# Patient Record
Sex: Female | Born: 1970 | Race: White | Hispanic: No | Smoking: Former smoker
Health system: Southern US, Community
[De-identification: ages and names within clinical notes are randomized; demographics above are authoritative.]

## PROBLEM LIST (undated history)

## (undated) DIAGNOSIS — Z8489 Family history of other specified conditions: Secondary | ICD-10-CM

## (undated) DIAGNOSIS — M940 Chondrocostal junction syndrome [Tietze]: Secondary | ICD-10-CM

## (undated) DIAGNOSIS — F419 Anxiety disorder, unspecified: Secondary | ICD-10-CM

## (undated) HISTORY — DX: Chondrocostal junction syndrome (tietze): M94.0

## (undated) HISTORY — PX: NO PAST SURGERIES: SHX2092

## (undated) HISTORY — DX: Anxiety disorder, unspecified: F41.9

---

## 2008-05-05 DIAGNOSIS — R519 Headache, unspecified: Secondary | ICD-10-CM | POA: Insufficient documentation

## 2008-05-05 DIAGNOSIS — K279 Peptic ulcer, site unspecified, unspecified as acute or chronic, without hemorrhage or perforation: Secondary | ICD-10-CM | POA: Insufficient documentation

## 2008-05-05 HISTORY — DX: Headache, unspecified: R51.9

## 2008-05-05 HISTORY — DX: Peptic ulcer, site unspecified, unspecified as acute or chronic, without hemorrhage or perforation: K27.9

## 2012-11-10 DIAGNOSIS — Q676 Pectus excavatum: Secondary | ICD-10-CM | POA: Insufficient documentation

## 2012-11-10 DIAGNOSIS — G8929 Other chronic pain: Secondary | ICD-10-CM

## 2012-11-10 HISTORY — DX: Other chronic pain: G89.29

## 2012-11-10 HISTORY — DX: Pectus excavatum: Q67.6

## 2012-11-25 DIAGNOSIS — R002 Palpitations: Secondary | ICD-10-CM | POA: Insufficient documentation

## 2012-11-25 HISTORY — DX: Palpitations: R00.2

## 2013-03-21 ENCOUNTER — Emergency Department (HOSPITAL_COMMUNITY): Payer: BC Managed Care – PPO

## 2013-03-21 ENCOUNTER — Encounter (HOSPITAL_COMMUNITY): Payer: Self-pay | Admitting: Emergency Medicine

## 2013-03-21 ENCOUNTER — Emergency Department (HOSPITAL_COMMUNITY)
Admission: EM | Admit: 2013-03-21 | Discharge: 2013-03-21 | Disposition: A | Discharge status: Home / Self Care | Payer: BC Managed Care – PPO | Attending: Emergency Medicine | Admitting: Emergency Medicine

## 2013-03-21 DIAGNOSIS — Y9239 Other specified sports and athletic area as the place of occurrence of the external cause: Secondary | ICD-10-CM | POA: Insufficient documentation

## 2013-03-21 DIAGNOSIS — Z91040 Latex allergy status: Secondary | ICD-10-CM | POA: Insufficient documentation

## 2013-03-21 DIAGNOSIS — Y9364 Activity, baseball: Secondary | ICD-10-CM | POA: Insufficient documentation

## 2013-03-21 DIAGNOSIS — Z791 Long term (current) use of non-steroidal anti-inflammatories (NSAID): Secondary | ICD-10-CM | POA: Insufficient documentation

## 2013-03-21 DIAGNOSIS — S0511XA Contusion of eyeball and orbital tissues, right eye, initial encounter: Secondary | ICD-10-CM

## 2013-03-21 DIAGNOSIS — S0010XA Contusion of unspecified eyelid and periocular area, initial encounter: Secondary | ICD-10-CM | POA: Insufficient documentation

## 2013-03-21 DIAGNOSIS — R110 Nausea: Secondary | ICD-10-CM | POA: Insufficient documentation

## 2013-03-21 DIAGNOSIS — W219XXA Striking against or struck by unspecified sports equipment, initial encounter: Secondary | ICD-10-CM | POA: Insufficient documentation

## 2013-03-21 DIAGNOSIS — Y92838 Other recreation area as the place of occurrence of the external cause: Secondary | ICD-10-CM

## 2013-03-21 MED ORDER — TETRACAINE HCL 0.5 % OP SOLN
1.0000 [drp] | Freq: Once | OPHTHALMIC | Status: AC
  Administered 2013-03-21: 2 [drp] via OPHTHALMIC
  Filled 2013-03-21: qty 2

## 2013-03-21 MED ORDER — FLUORESCEIN SODIUM 1 MG OP STRP
1.0000 | ORAL_STRIP | Freq: Once | OPHTHALMIC | Status: AC
  Administered 2013-03-21: 1 via OPHTHALMIC
  Filled 2013-03-21: qty 1

## 2013-03-21 MED ORDER — IBUPROFEN 800 MG PO TABS: 800.0000 mg | ORAL_TABLET | Freq: Three times a day (TID) | ORAL | Status: DC

## 2013-03-21 MED ORDER — HYDROCODONE-ACETAMINOPHEN 5-325 MG PO TABS: 1.0000 | ORAL_TABLET | Freq: Four times a day (QID) | ORAL | Status: DC | PRN

## 2013-03-21 NOTE — Discharge Instructions (Signed)
Please follow up with your primary care physician in 1-2 days. If you do not have one please call the [facility] number listed above. Please follow up with the Facial Surgeon to schedule a follow up appointment. Please take pain medication as prescribed and as needed for pain. Please do not drive on narcotic pain medication. Please read all discharge instructions and return precautions.   Eye Contusion An eye contusion is a deep bruise of the eye. This is often called a "black eye." Contusions are the result of an injury that caused bleeding under the skin. The contusion may turn blue, purple, or yellow. Minor injuries will give you a painless contusion, but more severe contusions may stay painful and swollen for a few weeks. If the eye contusion only involves the eyelids and tissues around the eye, the injured area will get better within a few days to weeks. However, eye contusions can be serious and affect the eyeball and sight. CAUSES   Blunt injury or trauma to the face or eye area.  A forehead injury that causes the blood under the skin to work its way down to the eyelids.  Rubbing the eyes due to irritation. SYMPTOMS   Swelling and redness around the eye.  Bruising around the eye.  Tenderness, soreness, or pain around the eye.  Blurry vision.  Tearing.  Eyeball redness. DIAGNOSIS  A diagnosis is usually based on a thorough exam of the eye and surrounding area. The eye must be looked at carefully to make sure it is not injured and to make sure nothing else will threaten your vision. A vision test may be done. An X-ray or computed tomography (CT) scan may be needed to determine if there are any associated injuries, such as broken bones (fractures). TREATMENT  If there is an injury to the eye, treatment will be determined by the nature of the injury. HOME CARE INSTRUCTIONS   Put ice on the injured area.  Put ice in a plastic bag.  Place a towel between your  skin and the bag.  Leave the ice on for 15-20 minutes, 03-04 times a day.  If it is determined that there is no injury to the eye, you may continue normal activities.  Sunglasses may be worn to protect your eyes from bright light if light is uncomfortable.  Sleep with your head elevated. You can put an extra pillow under your head. This may help with discomfort.  Only take over-the-counter or prescription medicines for pain, discomfort, or fever as directed by your caregiver. Do not take aspirin for the first few days. This may increase bruising. SEEK IMMEDIATE MEDICAL CARE IF:   You have any form of vision loss.  You have double vision.  You feel nauseous.  You feel dizzy, sleepy, or like you will faint.  You have any fluid discharge from the eye or your nose.  You have swelling and discoloration that does not fade. MAKE SURE YOU:   Understand these instructions.  Will watch your condition.  Will get help right away if you are not doing well or get worse. Document Released: 01/26/2000 Document Revised: 04/22/2011 Document Reviewed: 12/14/2010 [facility] Patient Information 2014 [place].  Establish relationship with primary care doctor as discussed. A resource guide and information on the Affordable Care Act has been provided for your information.    RESOURCE GUIDE  If you do not have a primary care doctor to follow up with regarding today's visit, please call the  [facility] at [phone] to make an appointment. Hours of operation are 10am - 7pm, Monday through Friday, and they have a sliding scale fee.   Insufficient Money for Medicine: Contact [employer]:  call "211" or [facility] [phone].  No Primary Care Doctor: - Call [facility]  [phone] - can help you locate a primary care doctor that  accepts your insurance, provides certain services, etc. - Physician Referral Service- [phone]  Agencies that provide inexpensive  medical care: - [facility]  [phone] - [facility]  [phone] - [facility]  [phone] - [facility]  [phone] - [facility]  [phone] [facility]  [phone]  Medicaid-accepting [facility] Providers: - [facility]- [address]  [phone], Mon-Fri 9am-7pm, Sat 9am-1pm - [facility]- [address] [place], [address] [place]  [phone] - [facility]- [address], [address] [place]  [phone] [facility]- [address]  [phone] - Renaye Rakers- [address] [place], [address], [phone]  Only accepts [place] patients after they have their name  applied to their card  Self Pay (no insurance) in [place]: - Sickle Cell Patients: Dr Willey Blade, [facility]  [address] [place], [phone] - [facility]- [address] [place]  [phone]       [facility]- [address]       -     [facility]- see information above (Speak to [employer] if you do not have insurance)       -  [facility]- [address] [place], [phone]       -  [facility]- [address],  [phone]       -  [facility]- [address], [phone]       -  Dr Julio Sicks-  [address], [place], [phone]       -  [facility]- [address] [phone]       -  [facility]- [address] [phone], also [address] [phone]       -    [facility]- [address] [place], [phone], 1st & 3rd Saturday   every month, 10am-1pm  1) Find a Doctor and Pay Out of Pocket Although you won't have to find out who is covered by your insurance plan, it is a good idea to ask around and get recommendations. You will then need to call the office and see if the doctor you have chosen will accept you  as a new patient and what types of options they offer for patients who are self-pay. Some doctors offer discounts or will set up payment plans for their patients who do not have insurance, but you will need to ask so you aren't surprised when you get to your appointment.  2) Contact Your Local Health Department Not all health departments have doctors that can see patients for sick visits, but many do, so it is worth a call to see if yours does. If you don't know where your local health department is, you can check in your phone book. The CDC also has a tool to help you locate your state's health department,  and many state websites also have listings of all of their local health departments.  3) Find a Walk-in Clinic If your illness is not likely to be very severe or complicated, you may want to try a walk in clinic. These are popping up all over the country in pharmacies, drugstores, and shopping centers. They're usually staffed by nurse practitioners or physician assistants that have been trained to treat common illnesses and complaints. They're usually fairly quick and inexpensive. However, if you have serious medical issues or chronic medical problems, these are probably not your best option

## 2013-03-21 NOTE — ED Notes (Signed)
PA at bedside.

## 2013-03-21 NOTE — ED Notes (Signed)
New ice pack give.

## 2013-03-21 NOTE — ED Notes (Signed)
Spoke with CT regarding delay.  They are aware of pt.

## 2013-03-21 NOTE — ED Notes (Signed)
Patient reports that she was playing baseball and was hit in the right eye by a baseball.  Denies loc or changes in level of consciousness, denies falling after being struck, denies neck pain. Remains full mobility of her right eye but reports pain when she looks to the left.  Patient also states that since her injury she has felt nauseated and dizzy intermittently, but has not vomited.

## 2013-03-21 NOTE — ED Notes (Signed)
Pt presents to department for evaluation of facial injury. Pt states she was hit in face with baseball. Swelling noted to forehead and R eye. Denies LOC. No nausea/vomiting. Pt is alert and oriented x4. 8/10 pain at the time.

## 2013-03-21 NOTE — ED Provider Notes (Signed)
CSN: [id number]     Arrival date & time 03/21/13  1632 History   First MD Initiated Contact with Patient 03/21/13 1831     Chief Complaint  Patient presents with  . Facial Pain   (Consider location/radiation/quality/duration/timing/severity/associated sxs/prior Treatment) HPI Comments: Patient is an otherwise healthy 43 year old female presenting to the emergency department after she was hit in the face with a straight baseball. Patient notes she had immediate pain to the right orbital region and right forehead with swelling. She states she did have some visual disturbance immediately after the injury and felt nauseous but denies any loss of consciousness. She states she has been icing her injury that has alleviated her pain. She denies any aggravating factors. She denies wanting anything for pain at this time. Patient denies any visual disturbance currently, contact lens use, emesis, confusion.   History reviewed. No pertinent past medical history. History reviewed. No pertinent past surgical history. No family history on file. History  Substance Use Topics  . Smoking status: Never Smoker   . Smokeless tobacco: Not on file  . Alcohol Use: Yes     Comment: social   OB History   Grav Para Term Preterm Abortions TAB SAB Ect Mult Living                 Review of Systems  Constitutional: Negative for fever and chills.  HENT: Positive for facial swelling.   Eyes:       Orbital swelling and bruising  Gastrointestinal: Positive for nausea. Negative for vomiting.  Neurological: Negative for syncope.  All other systems reviewed and are negative.    Allergies  Other and Latex  Home Medications   Current Outpatient Rx  Name  Route  Sig  Dispense  Refill  . acetaminophen (TYLENOL) 500 MG tablet   Oral   Take 500 mg by mouth every 6 (six) hours as needed for moderate pain.         Marland Kitchen. ibuprofen (ADVIL,MOTRIN) 200 MG tablet   Oral   Take 400 mg by mouth every 6 (six) hours as  needed for headache.         Marland Kitchen. HYDROcodone-acetaminophen (NORCO/VICODIN) 5-325 MG per tablet   Oral   Take 1 tablet by mouth every 6 (six) hours as needed for severe pain.   12 tablet   0   . ibuprofen (ADVIL,MOTRIN) 800 MG tablet   Oral   Take 1 tablet (800 mg total) by mouth 3 (three) times daily.   21 tablet   0    BP 118/80  Pulse 65  Temp(Src) 97.4 F (36.3 C) (Oral)  Resp 18  Ht 5\' 6"  (1.676 m)  Wt 140 lb (63.504 kg)  BMI 22.61 kg/m2  SpO2 98%  LMP 03/14/2013 Physical Exam  Constitutional: She is oriented to person, place, and time. She appears well-developed and well-nourished. No distress.  HENT:  Head: Normocephalic and atraumatic.  Right Ear: External ear normal.  Left Ear: External ear normal.  Nose: Nose normal.  Mouth/Throat: Oropharynx is clear and moist. No oropharyngeal exudate.  Eyes: Conjunctivae and EOM are normal. Pupils are equal, round, and reactive to light. Right eye exhibits no discharge and no exudate. No foreign body present in the right eye. Left eye exhibits no discharge and no exudate. No foreign body present in the left eye.  Fundoscopic exam:      The right eye shows no hemorrhage and no papilledema.       The left eye  shows no hemorrhage and no papilledema.  Slit lamp exam:      The right eye shows no corneal abrasion, no corneal flare, no corneal ulcer, no foreign body and no fluorescein uptake.    Visual Acuity:Bilateral Distance: 20/25 ; R Distance: 20/70 ; L Distance: 20/50  Neck: Normal range of motion. Neck supple.  Cardiovascular: Normal rate, regular rhythm and normal heart sounds.   Pulmonary/Chest: Effort normal and breath sounds normal. No respiratory distress.  Abdominal: Soft. There is no tenderness.  Musculoskeletal: Normal range of motion.  Neurological: She is alert and oriented to person, place, and time. She has normal strength. No cranial nerve deficit or sensory deficit. Gait normal. GCS eye subscore is 4. GCS  verbal subscore is 5. GCS motor subscore is 6.  Bilateral heel-knee-shin intact. No pronator drift.   Skin: Skin is warm and dry. She is not diaphoretic.  Psychiatric: She has a normal mood and affect.    ED Course  Procedures (including critical care time) Medications  tetracaine (PONTOCAINE) 0.5 % ophthalmic solution 1-2 drop (2 drops Right Eye Given 03/21/13 2120)  fluorescein ophthalmic strip 1 strip (1 strip Right Eye Given 03/21/13 2119)    Labs Review Labs Reviewed - No data to display Imaging Review Ct Maxillofacial Wo Cm  03/21/2013   CLINICAL DATA:  Struck in face with a baseball. Right periorbital swelling.  EXAM: CT MAXILLOFACIAL WITHOUT CONTRAST  TECHNIQUE: Multidetector CT imaging of the maxillofacial structures was performed. Multiplanar CT image reconstructions were also generated. A small metallic BB was placed on the right temple in order to reliably differentiate right from left.  COMPARISON:  None.  FINDINGS: There is moderate periorbital and preseptal soft tissue swelling on the right. No postseptal hematoma is demonstrated. There is no evidence of globe injury or lens displacement. The extra-ocular muscles and optic nerves appear normal.  There is no evidence of orbital or other facial fracture. The paranasal sinuses are clear without air-fluid levels. The mastoids and middle ears are clear.  IMPRESSION: 1. Preseptal and periorbital soft tissue injury on the right. No evidence of postseptal hematoma or globe injury. 2. No evidence of orbital or facial fracture.   Electronically Signed   By: Roxy Horseman M.D.   On: 03/21/2013 20:16    EKG Interpretation   None       MDM   1. Periorbital contusion of right eye    Filed Vitals:   03/21/13 2144  BP: 118/80  Pulse: 65  Temp:   Resp: 18    Afebrile, NAD, non-toxic appearing, AAOx4. No neurofocal deficits. No evidence of entrapment on EOM. R periorbital contusion. No corneal abrasion or ulcer on fluorescein stain. CT  scan negative for periorbital or other facial fracture. Maxillofacial referral given for f/u. Symptoms managed in ED. Return precautions discussed. Patient is agreeable to plan. Patient is stable at time of discharge. Patient d/w with Dr. Redgie Grayer, agrees with plan.         Lise Auer Doralee Kocak, PA-C 03/22/13 0025

## 2013-03-21 NOTE — ED Notes (Signed)
Patient transported to CT 

## 2013-03-22 NOTE — ED Provider Notes (Signed)
Medical screening examination/treatment/procedure(s) were performed by non-physician practitioner and as supervising physician I was immediately available for consultation/collaboration.  EKG Interpretation   None          Leibish Mcgregor, MD 03/22/13 0027 

## 2017-02-19 DIAGNOSIS — N898 Other specified noninflammatory disorders of vagina: Secondary | ICD-10-CM | POA: Insufficient documentation

## 2017-02-19 HISTORY — DX: Other specified noninflammatory disorders of vagina: N89.8

## 2018-11-11 DIAGNOSIS — E782 Mixed hyperlipidemia: Secondary | ICD-10-CM

## 2018-11-11 HISTORY — DX: Mixed hyperlipidemia: E78.2

## 2019-01-15 LAB — HM MAMMOGRAPHY

## 2019-03-30 LAB — HM PAP SMEAR: HM Pap smear: NORMAL

## 2019-09-17 ENCOUNTER — Other Ambulatory Visit: Payer: Self-pay

## 2019-09-17 ENCOUNTER — Emergency Department (INDEPENDENT_AMBULATORY_CARE_PROVIDER_SITE_OTHER)
Admission: EM | Admit: 2019-09-17 | Discharge: 2019-09-17 | Disposition: A | Discharge status: Home / Self Care | Payer: BC Managed Care – PPO | Source: Home / Self Care | Attending: Family Medicine | Admitting: Family Medicine

## 2019-09-17 DIAGNOSIS — L03316 Cellulitis of umbilicus: Secondary | ICD-10-CM | POA: Diagnosis not present

## 2019-09-17 MED ORDER — KETOCONAZOLE 2 % EX CREA: 1.0000 "application " | TOPICAL_CREAM | Freq: Every day | CUTANEOUS | 0 refills | Status: DC

## 2019-09-17 MED ORDER — MUPIROCIN CALCIUM 2 % EX CREA: TOPICAL_CREAM | CUTANEOUS | 1 refills | Status: DC

## 2019-09-17 NOTE — ED Triage Notes (Signed)
Patient presents to Urgent Care with complaints of itching and discharge from her navel since two days ago. Patient reports it itched at first and then started to have some yellow drainage, has been putting lotion on the itchy area.

## 2019-09-17 NOTE — Discharge Instructions (Addendum)
Keep area clean and dry. °

## 2019-09-17 NOTE — ED Provider Notes (Signed)
[facility]    CSN: [id number] Arrival date & time: 09/17/19  0824      History   Chief Complaint Chief Complaint  Patient presents with   Rash    HPI Veronica Pennington is a 49 y.o. female.   Patient complains of itching and slight drainage from her umbilical area that started two days ago.  The area is not swollen or painful.  The history is provided by the patient.  Rash Location: umbilicus. Quality: itchiness, redness and weeping   Quality: not blistering, not bruising, not burning, not draining, not painful, not peeling, not scaling and not swelling   Severity:  Mild Onset quality:  Gradual Duration:  2 days Timing:  Constant Progression:  Worsening Chronicity:  New Context: not animal contact, not chemical exposure, not hot tub use, not insect bite/sting, not medications, not new detergent/soap and not plant contact   Relieved by:  Nothing Worsened by:  Nothing Ineffective treatments: moisturizing lotion. Associated symptoms: no abdominal pain, no diarrhea, no fatigue, no fever and no induration     History reviewed. No pertinent past medical history.  There are no problems to display for this patient.   History reviewed. No pertinent surgical history.  OB History   No obstetric history on file.      Home Medications    Prior to Admission medications   Medication Sig Start Date End Date Taking? Authorizing Provider  acetaminophen (TYLENOL) 500 MG tablet Take 500 mg by mouth every 6 (six) hours as needed for moderate pain.    [provider]  HYDROcodone-acetaminophen (NORCO/VICODIN) 5-325 MG per tablet Take 1 tablet by mouth every 6 (six) hours as needed for severe pain. 03/21/13   Piepenbrink, Victorino Dike, PA-C  ibuprofen (ADVIL,MOTRIN) 200 MG tablet Take 400 mg by mouth every 6 (six) hours as needed for headache.    [provider]  ibuprofen (ADVIL,MOTRIN) 800 MG tablet Take 1 tablet (800 mg total) by mouth 3 (three)  times daily. 03/21/13   Piepenbrink, Victorino Dike, PA-C  ketoconazole (NIZORAL) 2 % cream Apply 1 application topically daily. Use for 2 weeks 09/17/19   Lattie Haw, MD  mupirocin cream (BACTROBAN) 2 % Apply to affected area 3 times daily. Use for 10 days 09/17/19 09/16/20  Lattie Haw, MD    Family History Family History  Problem Relation Age of Onset   Hypertension Mother    Rheum arthritis Mother     Social History Social History   Tobacco Use   Smoking status: Never Smoker   Smokeless tobacco: Never Used  Substance Use Topics   Alcohol use: Yes    Comment: social   Drug use: No     Allergies   Other and Latex   Review of Systems Review of Systems  Constitutional: Negative for fatigue and fever.  Gastrointestinal: Negative for abdominal pain and diarrhea.  Skin: Positive for rash.  All other systems reviewed and are negative.    Physical Exam Triage Vital Signs ED Triage Vitals  Enc Vitals Group     BP 09/17/19 0840 125/81     Pulse Rate 09/17/19 0840 62     Resp 09/17/19 0840 16     Temp 09/17/19 0840 98.2 F (36.8 C)     Temp Source 09/17/19 0840 Oral     SpO2 09/17/19 0840 99 %     Weight --      Height --      Head Circumference --  Peak Flow --      Pain Score 09/17/19 0839 0     Pain Loc --      Pain Edu? --      Excl. in GC? --    No data found.  Updated Vital Signs BP 125/81 (BP Location: Right Arm)    Pulse 62    Temp 98.2 F (36.8 C) (Oral)    Resp 16    LMP 02/16/2018 (Approximate)    SpO2 99%   Visual Acuity Right Eye Distance:   Left Eye Distance:   Bilateral Distance:    Right Eye Near:   Left Eye Near:    Bilateral Near:     Physical Exam Vitals and nursing note reviewed.  Constitutional:      General: She is not in acute distress. HENT:     Head: Normocephalic.     Mouth/Throat:     Pharynx: Oropharynx is clear.  Eyes:     Pupils: Pupils are equal, round, and reactive to light.  Cardiovascular:     Rate  and Rhythm: Normal rate.  Pulmonary:     Effort: Pulmonary effort is normal.  Abdominal:     General: Abdomen is flat.     Palpations: Abdomen is soft.     Tenderness: There is no abdominal tenderness.       Comments: Umbilicus has mild erythema with minimal discharge present.  No tenderness.  There is faint erythema spreading above the umbilicus.  Skin:    General: Skin is warm and dry.  Neurological:     Mental Status: She is alert.      UC Treatments / Results  Labs (all labs ordered are listed, but only abnormal results are displayed) Labs Reviewed  WOUND CULTURE    EKG   Radiology No results found.  Procedures Procedures (including critical care time)  Medications Ordered in UC Medications - No data to display  Initial Impression / Assessment and Plan / UC Course  I have reviewed the triage vital signs and the nursing notes.  Pertinent labs & imaging results that were available during my care of the patient were reviewed by me and considered in my medical decision making (see chart for details).    Culture pending.  Suspect candida intertrigo with secondary bacterial infection. Patient prefers topical treatment; begin mupirocin cream.  Begin empiric Nizoral cream also. If not improving about 5 to 7 days, switch to doxycycline.   Final Clinical Impressions(s) / UC Diagnoses   Final diagnoses:  Cellulitis of umbilicus     Discharge Instructions     Keep area clean and dry.    ED Prescriptions    Medication Sig Dispense Auth. Provider   ketoconazole (NIZORAL) 2 % cream Apply 1 application topically daily. Use for 2 weeks 30 g Lattie Haw, MD   mupirocin cream (BACTROBAN) 2 % Apply to affected area 3 times daily. Use for 10 days 15 g Lattie Haw, MD        Lattie Haw, MD 09/20/19 1213

## 2019-09-20 LAB — WOUND CULTURE
MICRO NUMBER:: 10796739
SPECIMEN QUALITY:: ADEQUATE

## 2019-09-23 ENCOUNTER — Telehealth: Payer: Self-pay | Admitting: Emergency Medicine

## 2019-09-23 NOTE — Telephone Encounter (Signed)
Normal skin flora, continue using cream until healed, it is getting better.

## 2019-10-25 ENCOUNTER — Ambulatory Visit (INDEPENDENT_AMBULATORY_CARE_PROVIDER_SITE_OTHER): Payer: BC Managed Care – PPO | Admitting: Medical-Surgical

## 2019-10-25 ENCOUNTER — Other Ambulatory Visit: Payer: Self-pay | Admitting: Medical-Surgical

## 2019-10-25 ENCOUNTER — Encounter: Payer: Self-pay | Admitting: Medical-Surgical

## 2019-10-25 VITALS — BP 100/62 | HR 54 | Temp 97.6°F | Ht 67.5 in | Wt 168.7 lb

## 2019-10-25 DIAGNOSIS — L0292 Furuncle, unspecified: Secondary | ICD-10-CM

## 2019-10-25 DIAGNOSIS — F419 Anxiety disorder, unspecified: Secondary | ICD-10-CM | POA: Diagnosis not present

## 2019-10-25 DIAGNOSIS — Z7689 Persons encountering health services in other specified circumstances: Secondary | ICD-10-CM

## 2019-10-25 DIAGNOSIS — M940 Chondrocostal junction syndrome [Tietze]: Secondary | ICD-10-CM | POA: Diagnosis not present

## 2019-10-25 DIAGNOSIS — L0293 Carbuncle, unspecified: Secondary | ICD-10-CM

## 2019-10-25 DIAGNOSIS — Z0000 Encounter for general adult medical examination without abnormal findings: Secondary | ICD-10-CM

## 2019-10-25 HISTORY — DX: Furuncle, unspecified: L02.92

## 2019-10-25 HISTORY — DX: Carbuncle, unspecified: L02.93

## 2019-10-25 NOTE — Progress Notes (Signed)
New Patient Office Visit  Subjective:  Patient ID: Veronica Pennington, female    DOB: 08-Sep-1970  Age: 49 y.o. MRN: [id number]  CC:  Chief Complaint  Patient presents with  . Establish Care    HPI [name] presents to establish care.  Costochondritis-recurrent, treats with heat, stretches, cold which are moderately helpful; some flares for several day then resolves.  Anxiety- worse since husband passed away. Seeing a counselor every 2-3 weeks. Tried a medication in April but did not tolerate.   Boils- recurring sores in different locations (buttocs, neck, forehead, and umbilicus). Usually treats successfully with Mupirocin ointment.   Past Medical History:  Diagnosis Date  . Anxiety   . Costochondritis     Past Surgical History:  Procedure Laterality Date  . NO PAST SURGERIES      Family History  Problem Relation Age of Onset  . Rheum arthritis Mother   . Hypertension Mother   . Ankylosing spondylitis Mother   . Heart attack Father   . Hypertension Father   . Stroke Maternal Grandfather     Social History   Socioeconomic History  . Marital status: Widowed    Spouse name: Not on file  . Number of children: Not on file  . Years of education: Not on file  . Highest education level: Not on file  Occupational History  . Not on file  Tobacco Use  . Smoking status: Former Smoker    Packs/day: 1.00    Years: 10.00    Pack years: 10.00    Quit date: 1998    Years since quitting: 23.7  . Smokeless tobacco: Never Used  Substance and Sexual Activity  . Alcohol use: Yes    Comment: Occasionally  . Drug use: No  . Sexual activity: Not Currently  Other Topics Concern  . Not on file  Social History Narrative  . Not on file   Social Determinants of Health   Financial Resource Strain:   . Difficulty of Paying Living Expenses: Not on file  Food Insecurity:   . Worried About Programme researcher, broadcasting/film/video in the Last Year: Not on file  . Ran Out of Food  in the Last Year: Not on file  Transportation Needs:   . Lack of Transportation (Medical): Not on file  . Lack of Transportation (Non-Medical): Not on file  Physical Activity:   . Days of Exercise per Week: Not on file  . Minutes of Exercise per Session: Not on file  Stress:   . Feeling of Stress : Not on file  Social Connections:   . Frequency of Communication with Friends and Family: Not on file  . Frequency of Social Gatherings with Friends and Family: Not on file  . Attends Religious Services: Not on file  . Active Member of Clubs or Organizations: Not on file  . Attends Banker Meetings: Not on file  . Marital Status: Not on file  Intimate Partner Violence:   . Fear of Current or Ex-Partner: Not on file  . Emotionally Abused: Not on file  . Physically Abused: Not on file  . Sexually Abused: Not on file    ROS Review of Systems  Objective:   Today's Vitals: BP 100/62   Pulse (!) 54   Temp 97.6 F (36.4 C) (Oral)   Ht 5' 7.5" (1.715 m)   Wt 168 lb 11.2 oz (76.5 kg)   LMP 02/16/2018 (Approximate)   SpO2 98%   BMI 26.03 kg/m  Physical Exam  Assessment & Plan:   1. Encounter to establish care Reviewed available information and discussed health care concerns with patient.  She will be due for annual physical exam with lab work soon.  2. Anxiety Continue therapy every 2 to 3 weeks.  Encouraged to the me know if she should decide she would like to retry medication.  3. Costochondritis Continue conservative treatment at home.  4. Recurrent boils None currently.  Outpatient Encounter Medications as of 10/25/2019  Medication Sig  . acetaminophen (TYLENOL) 500 MG tablet Take 500 mg by mouth every 6 (six) hours as needed for moderate pain.  Marland Kitchen ascorbic acid (VITAMIN C) 500 MG tablet Take 1 tablet by mouth daily as needed.  Marland Kitchen b complex vitamins capsule Take 1 capsule by mouth daily as needed.  Marland Kitchen ibuprofen (ADVIL,MOTRIN) 200 MG tablet Take 400 mg by mouth  every 6 (six) hours as needed for headache.  Marland Kitchen MAGNESIUM PO Take 1 tablet by mouth daily as needed.  . Potassium Gluconate 2.5 MEQ TABS Take 1 tablet by mouth daily as needed.  Marland Kitchen VITAMIN D PO Take 1 tablet by mouth daily as needed.  . [DISCONTINUED] HYDROcodone-acetaminophen (NORCO/VICODIN) 5-325 MG per tablet Take 1 tablet by mouth every 6 (six) hours as needed for severe pain. (Patient not taking: Reported on 10/25/2019)  . [DISCONTINUED] ibuprofen (ADVIL,MOTRIN) 800 MG tablet Take 1 tablet (800 mg total) by mouth 3 (three) times daily. (Patient not taking: Reported on 10/25/2019)  . [DISCONTINUED] ketoconazole (NIZORAL) 2 % cream Apply 1 application topically daily. Use for 2 weeks (Patient not taking: Reported on 10/25/2019)  . [DISCONTINUED] mupirocin cream (BACTROBAN) 2 % Apply to affected area 3 times daily. Use for 10 days (Patient not taking: Reported on 10/25/2019)   No facility-administered encounter medications on file as of 10/25/2019.    Follow-up: Return for annual physical exam at your convenience.   Thayer Ohm, DNP, APRN, FNP-BC [facility]

## 2020-04-10 LAB — COMPLETE METABOLIC PANEL WITH GFR
AG Ratio: 2.2 (calc) (ref 1.0–2.5)
ALT: 21 U/L (ref 6–29)
AST: 18 U/L (ref 10–35)
Albumin: 4.4 g/dL (ref 3.6–5.1)
Alkaline phosphatase (APISO): 56 U/L (ref 37–153)
BUN: 12 mg/dL (ref 7–25)
CO2: 24 mmol/L (ref 20–32)
Calcium: 9 mg/dL (ref 8.6–10.4)
Chloride: 107 mmol/L (ref 98–110)
Creat: 0.76 mg/dL (ref 0.50–1.05)
GFR, Est African American: 106 mL/min/{1.73_m2} (ref >=60)
GFR, Est Non African American: 91 mL/min/{1.73_m2} (ref >=60)
Globulin: 2 g/dL (calc) (ref 1.9–3.7)
Glucose, Bld: 98 mg/dL (ref 65–99)
Potassium: 4.5 mmol/L (ref 3.5–5.3)
Sodium: 139 mmol/L (ref 135–146)
Total Bilirubin: 0.5 mg/dL (ref 0.2–1.2)
Total Protein: 6.4 g/dL (ref 6.1–8.1)

## 2020-04-10 LAB — CBC
HCT: 40.1 % (ref 35.0–45.0)
Hemoglobin: 13.6 g/dL (ref 11.7–15.5)
MCH: 31.1 pg (ref 27.0–33.0)
MCHC: 33.9 g/dL (ref 32.0–36.0)
MCV: 91.8 fL (ref 80.0–100.0)
MPV: 11.4 fL (ref 7.5–12.5)
Platelets: 194 10*3/uL (ref 140–400)
RBC: 4.37 10*6/uL (ref 3.80–5.10)
RDW: 12 % (ref 11.0–15.0)
WBC: 4.5 10*3/uL (ref 3.8–10.8)

## 2020-04-10 LAB — LIPID PANEL
Cholesterol: 220 mg/dL — ABNORMAL HIGH (ref <200)
HDL: 58 mg/dL (ref >=50)
LDL Cholesterol (Calc): 140 mg/dL (calc) — ABNORMAL HIGH
Non-HDL Cholesterol (Calc): 162 mg/dL (calc) — ABNORMAL HIGH (ref <130)
Total CHOL/HDL Ratio: 3.8 (calc) (ref <5.0)
Triglycerides: 102 mg/dL (ref <150)

## 2020-04-11 NOTE — Progress Notes (Signed)
HPI: Veronica Pennington is a 50 y.o. female who  has a past medical history of Anxiety and Costochondritis.  she presents to [facility] today, 04/12/20,  for chief complaint of: Annual physical exam  Dentist: Every 6 months, no current concerns Eye exam: Up-to-date, wears glasses, no concerns Exercise: None intentional Diet: Avoids milk or ice cream but otherwise eats all food groups Pap smear: Has this scheduled but thinks her last 1 was in 2020 Mammogram: Completed last month Colon cancer screening: None yet, reviewed options and she will let me know which 1 she would like to do COVID vaccine: Done, no booster yet  Concerns:  Past medical, surgical, social and family history reviewed:  Patient Active Problem List   Diagnosis Date Noted  . Anxiety 10/25/2019  . Costochondritis 10/25/2019  . Recurrent boils 10/25/2019    Past Surgical History:  Procedure Laterality Date  . NO PAST SURGERIES      Social History   Tobacco Use  . Smoking status: Former Smoker    Packs/day: 1.00    Years: 10.00    Pack years: 10.00    Quit date: 1998    Years since quitting: 24.1  . Smokeless tobacco: Never Used  Substance Use Topics  . Alcohol use: Not Currently    Family History  Problem Relation Age of Onset  . Rheum arthritis Mother   . Hypertension Mother   . Ankylosing spondylitis Mother   . Heart attack Father   . Hypertension Father   . Stroke Maternal Grandfather      Current medication list and allergy/intolerance information reviewed:    Current Outpatient Medications  Medication Sig Dispense Refill  . acetaminophen (TYLENOL) 500 MG tablet Take 500 mg by mouth every 6 (six) hours as needed for moderate pain.    Marland Kitchen ascorbic acid (VITAMIN C) 500 MG tablet Take 1 tablet by mouth daily as needed.    Marland Kitchen b complex vitamins capsule Take 1 capsule by mouth daily as needed.    Marland Kitchen ibuprofen (ADVIL,MOTRIN) 200 MG tablet Take 400 mg by  mouth every 6 (six) hours as needed for headache.    Marland Kitchen MAGNESIUM PO Take 1 tablet by mouth daily as needed.    Marland Kitchen omeprazole (PRILOSEC) 20 MG capsule Take 1 capsule by mouth daily.    . Potassium Gluconate 2.5 MEQ TABS Take 1 tablet by mouth daily as needed.    Marland Kitchen VITAMIN D PO Take 1 tablet by mouth daily as needed.     No current facility-administered medications for this visit.    Allergies  Allergen Reactions  . Other Hives    "Emergen-C"  . Latex Itching and Rash      Review of Systems:  Constitutional:  No  fever, no chills, No recent illness, No unintentional weight changes. No significant fatigue.   HEENT: No  headache, no vision change, no hearing change, No sore throat, No  sinus pressure  Cardiac: + Intermittent chest pain, No  pressure, + intermittent palpitations, No  Orthopnea  Respiratory:  No  shortness of breath. No  Cough  Gastrointestinal: No  abdominal pain, No  nausea, No  vomiting,  No  blood in stool, No  diarrhea, + intermittent constipation   Musculoskeletal: No new myalgia/arthralgia  Skin: No  Rash, No other wounds/concerning lesions  Genitourinary: + Stress incontinence, No  abnormal genital bleeding, No abnormal genital discharge  Hem/Onc: No  easy bruising/bleeding, No  abnormal lymph node  Endocrine:  No cold intolerance,  No heat intolerance. No polyuria/polydipsia/polyphagia,+ hot flashes  Neurologic: No  weakness, No  dizziness, No  slurred speech/focal weakness/facial droop, + lightheadedness  Psychiatric: No  concerns with depression, + concerns with intermittent anxiety related to work stress, No sleep problems, No mood problems  Exam:  BP 101/67   Pulse (!) 56   Temp 97.7 F (36.5 C)   Ht 5' 7.5" (1.715 m)   Wt 179 lb 6.4 oz (81.4 kg)   LMP 02/16/2018 (Approximate)   SpO2 97%   BMI 27.68 kg/m   Constitutional: VS see above. General Appearance: alert, well-developed, well-nourished, NAD  Eyes: Normal lids and conjunctive,  non-icteric sclera  Ears, Nose, Mouth, Throat: MMM, Normal external inspection ears/nares/mouth/lips/gums. TM normal bilaterally.   Neck: No masses, trachea midline. No thyroid enlargement. No tenderness/mass appreciated. No lymphadenopathy  Respiratory: Normal respiratory effort. no wheeze, no rhonchi, no rales  Cardiovascular: S1/S2 normal, no murmur, no rub/gallop auscultated. RRR. No lower extremity edema.  No carotid bruit or JVD. No abdominal aortic bruit.  Gastrointestinal: Nontender, no masses. No hepatomegaly, no splenomegaly. No hernia appreciated. Bowel sounds normal. Rectal exam deferred.   Musculoskeletal: Gait normal. No clubbing/cyanosis of digits.   Neurological: Normal balance/coordination. No tremor. No cranial nerve deficit on limited exam. Motor and sensation intact and symmetric. Cerebellar reflexes intact.   Skin: warm, dry, intact. No rash/ulcer. No concerning nevi or subq nodules on limited exam.    Psychiatric: Normal judgment/insight. Normal mood and affect. Oriented x3.    Results for orders placed or performed in visit on 10/25/19 (from the past 72 hour(s))  CBC     Status: None   Collection Time: 04/10/20  8:20 AM  Result Value Ref Range   WBC 4.5 3.8 - 10.8 Thousand/uL   RBC 4.37 3.80 - 5.10 Million/uL   Hemoglobin 13.6 11.7 - 15.5 g/dL   HCT 40.1 35.0 - 45.0 %   MCV 91.8 80.0 - 100.0 fL   MCH 31.1 27.0 - 33.0 pg   MCHC 33.9 32.0 - 36.0 g/dL   RDW 12.0 11.0 - 15.0 %   Platelets 194 140 - 400 Thousand/uL   MPV 11.4 7.5 - 12.5 fL  COMPLETE METABOLIC PANEL WITH GFR     Status: None   Collection Time: 04/10/20  8:20 AM  Result Value Ref Range   Glucose, Bld 98 65 - 99 mg/dL    Comment: .            Fasting reference interval .    BUN 12 7 - 25 mg/dL   Creat 0.76 0.50 - 1.05 mg/dL    Comment: For patients >17 years of age, the reference limit for Creatinine is approximately 13% higher for people identified as African-American. .    GFR, Est  Non African American 91 > OR = 60 mL/min/1.95m   GFR, Est African American 106 > OR = 60 mL/min/1.776m  BUN/Creatinine Ratio NOT APPLICABLE 6 - 22 (calc)   Sodium 139 135 - 146 mmol/L   Potassium 4.5 3.5 - 5.3 mmol/L   Chloride 107 98 - 110 mmol/L   CO2 24 20 - 32 mmol/L   Calcium 9.0 8.6 - 10.4 mg/dL   Total Protein 6.4 6.1 - 8.1 g/dL   Albumin 4.4 3.6 - 5.1 g/dL   Globulin 2.0 1.9 - 3.7 g/dL (calc)   AG Ratio 2.2 1.0 - 2.5 (calc)   Total Bilirubin 0.5 0.2 - 1.2 mg/dL   Alkaline phosphatase (APISO)  56 37 - 153 U/L   AST 18 10 - 35 U/L   ALT 21 6 - 29 U/L  Lipid panel     Status: Abnormal   Collection Time: 04/10/20  8:20 AM  Result Value Ref Range   Cholesterol 220 (H) <200 mg/dL   HDL 58 > OR = 50 mg/dL   Triglycerides 102 <150 mg/dL   LDL Cholesterol (Calc) 140 (H) mg/dL (calc)    Comment: Reference range: <100 . Desirable range <100 mg/dL for primary prevention;   <70 mg/dL for patients with CHD or diabetic patients  with > or = 2 CHD risk factors. Marland Kitchen LDL-C is now calculated using the Martin-Hopkins  calculation, which is a validated novel method providing  better accuracy than the Friedewald equation in the  estimation of LDL-C.  Cresenciano Genre et al. Annamaria Helling. 0350;093(81): 2061-2068  (http://education.[employer].com/faq/FAQ164)    Total CHOL/HDL Ratio 3.8 <5.0 (calc)   Non-HDL Cholesterol (Calc) 162 (H) <130 mg/dL (calc)    Comment: For patients with diabetes plus 1 major ASCVD risk  factor, treating to a non-HDL-C goal of <100 mg/dL  (LDL-C of <70 mg/dL) is considered a therapeutic  option.     No results found.   ASSESSMENT/PLAN:   1. Annual physical exam Labs completed prior to her physical appointment.  Reviewed results and discussed recommendations for management of elevated cholesterol.  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  Up-to-date on preventative care aside from colon  cancer screening.  Advised to notify me of her preference for colon cancer screening and we will get that taken care of.   No orders of the defined types were placed in this encounter.   No orders of the defined types were placed in this encounter.   Patient Instructions   Preventive Care 90-68 Years Old, Female Preventive care refers to lifestyle choices and visits with your health care provider that can promote health and wellness. This includes:  A yearly physical exam. This is also called an annual wellness visit.  Regular dental and eye exams.  Immunizations.  Screening for certain conditions.  Healthy lifestyle choices, such as: ? Eating a healthy diet. ? Getting regular exercise. ? Not using drugs or products that contain nicotine and tobacco. ? Limiting alcohol use. What can I expect for my preventive care visit? Physical exam Your health care provider will check your:  Height and weight. These may be used to calculate your BMI (body mass index). BMI is a measurement that tells if you are at a healthy weight.  Heart rate and blood pressure.  Body temperature.  Skin for abnormal spots. Counseling Your health care provider may ask you questions about your:  Past medical problems.  Family's medical history.  Alcohol, tobacco, and drug use.  Emotional well-being.  Home life and relationship well-being.  Sexual activity.  Diet, exercise, and sleep habits.  Work and work Statistician.  Access to firearms.  Method of birth control.  Menstrual cycle.  Pregnancy history. What immunizations do I need? Vaccines are usually given at various ages, according to a schedule. Your health care provider will recommend vaccines for you based on your age, medical history, and lifestyle or other factors, such as travel or where you work.   What tests do I need? Blood tests  Lipid and cholesterol levels. These may be checked every 5 years, or more often if you are  over 14 years old.  Hepatitis C test.  Hepatitis B test. Screening  Lung cancer screening. You may have this screening every year starting at age 23 if you have a 30-pack-year history of smoking and currently smoke or have quit within the past 15 years.  Colorectal cancer screening. ? All adults should have this screening starting at age 11 and continuing until age 73. ? Your health care provider may recommend screening at age 48 if you are at increased risk. ? You will have tests every 1-10 years, depending on your results and the type of screening test.  Diabetes screening. ? This is done by checking your blood sugar (glucose) after you have not eaten for a while (fasting). ? You may have this done every 1-3 years.  Mammogram. ? This may be done every 1-2 years. ? Talk with your health care provider about when you should start having regular mammograms. This may depend on whether you have a family history of breast cancer.  BRCA-related cancer screening. This may be done if you have a family history of breast, ovarian, tubal, or peritoneal cancers.  Pelvic exam and Pap test. ? This may be done every 3 years starting at age 67. ? Starting at age 76, this may be done every 5 years if you have a Pap test in combination with an HPV test. Other tests  STD (sexually transmitted disease) testing, if you are at risk.  Bone density scan. This is done to screen for osteoporosis. You may have this scan if you are at high risk for osteoporosis. Talk with your health care provider about your test results, treatment options, and if necessary, the need for more tests. Follow these instructions at home: Eating and drinking  Eat a diet that includes fresh fruits and vegetables, whole grains, lean protein, and low-fat dairy products.  Take vitamin and mineral supplements as recommended by your health care provider.  Do not drink alcohol if: ? Your health care provider tells you not to  drink. ? You are pregnant, may be pregnant, or are planning to become pregnant.  If you drink alcohol: ? Limit how much you have to 0-1 drink a day. ? Be aware of how much alcohol is in your drink. In the [place] one drink equals one 12 oz bottle of beer (355 mL), one 5 oz glass of wine (148 mL), or one 1 oz glass of hard liquor (44 mL).   Lifestyle  Take daily care of your teeth and gums. Brush your teeth every morning and night with fluoride toothpaste. Floss one time each day.  Stay active. Exercise for at least 30 minutes 5 or more days each week.  Do not use any products that contain nicotine or tobacco, such as cigarettes, e-cigarettes, and chewing tobacco. If you need help quitting, ask your health care provider.  Do not use drugs.  If you are sexually active, practice safe sex. Use a condom or other form of protection to prevent STIs (sexually transmitted infections).  If you do not wish to become pregnant, use a form of birth control. If you plan to become pregnant, see your health care provider for a prepregnancy visit.  If told by your health care provider, take low-dose aspirin daily starting at age 31.  Find healthy ways to cope with stress, such as: ? Meditation, yoga, or listening to music. ? Journaling. ? Talking to a trusted person. ? Spending time with friends and family. Safety  Always wear your seat belt while driving or  riding in a vehicle.  Do not drive: ? If you have been drinking alcohol. Do not ride with someone who has been drinking. ? When you are tired or distracted. ? While texting.  Wear a helmet and other protective equipment during sports activities.  If you have firearms in your house, make sure you follow all gun safety procedures. What's next?  Visit your health care provider once a year for an annual wellness visit.  Ask your health care provider how often you should have your eyes and teeth checked.  Stay up to date on all  vaccines. This information is not intended to replace advice given to you by your health care provider. Make sure you discuss any questions you have with your health care provider. Document Revised: 11/02/2019 Document Reviewed: 10/09/2017 Elsevier Patient Education  2021 [employer].  Follow-up plan: Return in about 1 year (around 04/12/2021) for annual physical exam or sooner if needed.  Clearnce Sorrel, DNP, APRN, FNP-BC [facility]

## 2020-04-12 ENCOUNTER — Ambulatory Visit (INDEPENDENT_AMBULATORY_CARE_PROVIDER_SITE_OTHER): Payer: BC Managed Care – PPO | Admitting: Medical-Surgical

## 2020-04-12 ENCOUNTER — Other Ambulatory Visit: Payer: Self-pay

## 2020-04-12 ENCOUNTER — Encounter: Payer: Self-pay | Admitting: Medical-Surgical

## 2020-04-12 VITALS — BP 101/67 | HR 56 | Temp 97.7°F | Ht 67.5 in | Wt 179.4 lb

## 2020-04-12 DIAGNOSIS — Z0000 Encounter for general adult medical examination without abnormal findings: Secondary | ICD-10-CM | POA: Diagnosis not present

## 2020-04-12 NOTE — Patient Instructions (Signed)
Preventive Care 84-51 Years Old, Female Preventive care refers to lifestyle choices and visits with your health care provider that can promote health and wellness. This includes:  A yearly physical exam. This is also called an annual wellness visit.  Regular dental and eye exams.  Immunizations.  Screening for certain conditions.  Healthy lifestyle choices, such as: ? Eating a healthy diet. ? Getting regular exercise. ? Not using drugs or products that contain nicotine and tobacco. ? Limiting alcohol use. What can I expect for my preventive care visit? Physical exam Your health care provider will check your:  Height and weight. These may be used to calculate your BMI (body mass index). BMI is a measurement that tells if you are at a healthy weight.  Heart rate and blood pressure.  Body temperature.  Skin for abnormal spots. Counseling Your health care provider may ask you questions about your:  Past medical problems.  Family's medical history.  Alcohol, tobacco, and drug use.  Emotional well-being.  Home life and relationship well-being.  Sexual activity.  Diet, exercise, and sleep habits.  Work and work Statistician.  Access to firearms.  Method of birth control.  Menstrual cycle.  Pregnancy history. What immunizations do I need? Vaccines are usually given at various ages, according to a schedule. Your health care provider will recommend vaccines for you based on your age, medical history, and lifestyle or other factors, such as travel or where you work.   What tests do I need? Blood tests  Lipid and cholesterol levels. These may be checked every 5 years, or more often if you are over 3 years old.  Hepatitis C test.  Hepatitis B test. Screening  Lung cancer screening. You may have this screening every year starting at age 73 if you have a 30-pack-year history of smoking and currently smoke or have quit within the past 15 years.  Colorectal cancer  screening. ? All adults should have this screening starting at age 52 and continuing until age 17. ? Your health care provider may recommend screening at age 49 if you are at increased risk. ? You will have tests every 1-10 years, depending on your results and the type of screening test.  Diabetes screening. ? This is done by checking your blood sugar (glucose) after you have not eaten for a while (fasting). ? You may have this done every 1-3 years.  Mammogram. ? This may be done every 1-2 years. ? Talk with your health care provider about when you should start having regular mammograms. This may depend on whether you have a family history of breast cancer.  BRCA-related cancer screening. This may be done if you have a family history of breast, ovarian, tubal, or peritoneal cancers.  Pelvic exam and Pap test. ? This may be done every 3 years starting at age 10. ? Starting at age 11, this may be done every 5 years if you have a Pap test in combination with an HPV test. Other tests  STD (sexually transmitted disease) testing, if you are at risk.  Bone density scan. This is done to screen for osteoporosis. You may have this scan if you are at high risk for osteoporosis. Talk with your health care provider about your test results, treatment options, and if necessary, the need for more tests. Follow these instructions at home: Eating and drinking  Eat a diet that includes fresh fruits and vegetables, whole grains, lean protein, and low-fat dairy products.  Take vitamin and mineral supplements  as recommended by your health care provider.  Do not drink alcohol if: ? Your health care provider tells you not to drink. ? You are pregnant, may be pregnant, or are planning to become pregnant.  If you drink alcohol: ? Limit how much you have to 0-1 drink a day. ? Be aware of how much alcohol is in your drink. In the [place] one drink equals one 12 oz bottle of beer (355 mL), one 5 oz glass of  wine (148 mL), or one 1 oz glass of hard liquor (44 mL).   Lifestyle  Take daily care of your teeth and gums. Brush your teeth every morning and night with fluoride toothpaste. Floss one time each day.  Stay active. Exercise for at least 30 minutes 5 or more days each week.  Do not use any products that contain nicotine or tobacco, such as cigarettes, e-cigarettes, and chewing tobacco. If you need help quitting, ask your health care provider.  Do not use drugs.  If you are sexually active, practice safe sex. Use a condom or other form of protection to prevent STIs (sexually transmitted infections).  If you do not wish to become pregnant, use a form of birth control. If you plan to become pregnant, see your health care provider for a prepregnancy visit.  If told by your health care provider, take low-dose aspirin daily starting at age 50.  Find healthy ways to cope with stress, such as: ? Meditation, yoga, or listening to music. ? Journaling. ? Talking to a trusted person. ? Spending time with friends and family. Safety  Always wear your seat belt while driving or riding in a vehicle.  Do not drive: ? If you have been drinking alcohol. Do not ride with someone who has been drinking. ? When you are tired or distracted. ? While texting.  Wear a helmet and other protective equipment during sports activities.  If you have firearms in your house, make sure you follow all gun safety procedures. What's next?  Visit your health care provider once a year for an annual wellness visit.  Ask your health care provider how often you should have your eyes and teeth checked.  Stay up to date on all vaccines. This information is not intended to replace advice given to you by your health care provider. Make sure you discuss any questions you have with your health care provider. Document Revised: 11/02/2019 Document Reviewed: 10/09/2017 Elsevier Patient Education  2021 Elsevier Inc.  

## 2020-05-08 ENCOUNTER — Ambulatory Visit (INDEPENDENT_AMBULATORY_CARE_PROVIDER_SITE_OTHER): Payer: BC Managed Care – PPO

## 2020-05-08 ENCOUNTER — Ambulatory Visit (INDEPENDENT_AMBULATORY_CARE_PROVIDER_SITE_OTHER): Payer: BC Managed Care – PPO | Admitting: Medical-Surgical

## 2020-05-08 ENCOUNTER — Encounter: Payer: Self-pay | Admitting: Medical-Surgical

## 2020-05-08 ENCOUNTER — Other Ambulatory Visit: Payer: Self-pay

## 2020-05-08 VITALS — BP 118/76 | HR 60 | Temp 98.1°F | Ht 67.5 in | Wt 175.0 lb

## 2020-05-08 DIAGNOSIS — R319 Hematuria, unspecified: Secondary | ICD-10-CM | POA: Diagnosis not present

## 2020-05-08 DIAGNOSIS — R109 Unspecified abdominal pain: Secondary | ICD-10-CM | POA: Diagnosis not present

## 2020-05-08 DIAGNOSIS — R1032 Left lower quadrant pain: Secondary | ICD-10-CM

## 2020-05-08 DIAGNOSIS — N200 Calculus of kidney: Secondary | ICD-10-CM | POA: Diagnosis not present

## 2020-05-08 LAB — POCT URINALYSIS DIPSTICK
Bilirubin, UA: NEGATIVE
Glucose, UA: NEGATIVE
Ketones, UA: NEGATIVE
Nitrite, UA: NEGATIVE
Odor: NEGATIVE
Protein, UA: NEGATIVE
Spec Grav, UA: 1.025 (ref 1.010–1.025)
Urobilinogen, UA: 0.2 E.U./dL
pH, UA: 5.5 (ref 5.0–8.0)

## 2020-05-08 MED ORDER — TAMSULOSIN HCL 0.4 MG PO CAPS: 0.4000 mg | ORAL_CAPSULE | Freq: Every day | ORAL | 0 refills | Status: DC

## 2020-05-08 MED ORDER — TRAMADOL HCL 50 MG PO TABS: 50.0000 mg | ORAL_TABLET | Freq: Three times a day (TID) | ORAL | 0 refills | Status: AC | PRN

## 2020-05-08 NOTE — Progress Notes (Signed)
  Subjective:    CC: possible kidney stone  HPI: Pleasant 50 year old female presenting for evaluation of left flank pain that started 2 days ago, rated 2/10, constant.  Notes that her urine was brown over the weekend and she became concerned due to her mother having a history of kidney stones.  Denies dysuria, urgency, frequency, hesitancy, incomplete emptying, nausea, vomiting, fever, and chills.  No change in volume of urine.  Has not tried any medications for discomfort.  I reviewed the past medical history, family history, social history, surgical history, and allergies today and no changes were needed.  Please see the problem list section below in epic for further details.  Past Medical History: Past Medical History:  Diagnosis Date  . Anxiety   . Costochondritis    Past Surgical History: Past Surgical History:  Procedure Laterality Date  . NO PAST SURGERIES     Social History: Social History   Socioeconomic History  . Marital status: Widowed    Spouse name: Not on file  . Number of children: Not on file  . Years of education: Not on file  . Highest education level: Not on file  Occupational History  . Not on file  Tobacco Use  . Smoking status: Former Smoker    Packs/day: 1.00    Years: 10.00    Pack years: 10.00    Quit date: 1998    Years since quitting: 24.2  . Smokeless tobacco: Never Used  Substance and Sexual Activity  . Alcohol use: Not Currently  . Drug use: No  . Sexual activity: Not Currently  Other Topics Concern  . Not on file  Social History Narrative  . Not on file   Social Determinants of Health   Financial Resource Strain: Not on file  Food Insecurity: Not on file  Transportation Needs: Not on file  Physical Activity: Not on file  Stress: Not on file  Social Connections: Not on file   Family History: Family History  Problem Relation Age of Onset  . Rheum arthritis Mother   . Hypertension Mother   . Ankylosing spondylitis Mother   .  Heart attack Father   . Hypertension Father   . Stroke Maternal Grandfather    Allergies: Allergies  Allergen Reactions  . Other Hives    "Emergen-C"  . Latex Itching and Rash   Medications: See med rec.  Review of Systems: See HPI for pertinent positives and negatives.   Objective:    General: Well Developed, well nourished, and in no acute distress.  Neuro: Alert and oriented x3.  HEENT: Normocephalic, atraumatic.  Skin: Warm and dry. Cardiac: Regular rate and rhythm, no murmurs rubs or gallops, no lower extremity edema.  Respiratory: Clear to auscultation bilaterally. Not using accessory muscles, speaking in full sentences. Abdomen: Soft, nontender, nondistended. Bowel sounds + x 4 quadrants. No HSM appreciated. No CVA tenderness.  Impression and Recommendations:    1. Flank pain 2. Left lower quadrant abdominal pain POCT urinalysis positive for small leukocytes and large blood, otherwise normal.  Sending urine for culture.  Getting stat CT renal stone study for further evaluation.  CT resulted with a 10 mm stone in the left renal pelvis.  Referring to urology for further evaluation. - POCT Urinalysis Dipstick - Urine Culture - CT RENAL STONE STUDY; Future  Return if symptoms worsen or fail to improve. ___________________________________________ Thayer Ohm, DNP, APRN, FNP-BC Primary Care and Sports Medicine [facility] [place]

## 2020-05-10 ENCOUNTER — Other Ambulatory Visit: Payer: Self-pay | Admitting: Urology

## 2020-05-10 DIAGNOSIS — N200 Calculus of kidney: Secondary | ICD-10-CM

## 2020-05-10 LAB — URINE CULTURE
MICRO NUMBER:: 11704011
SPECIMEN QUALITY:: ADEQUATE

## 2020-05-11 ENCOUNTER — Other Ambulatory Visit (HOSPITAL_COMMUNITY)
Admission: RE | Admit: 2020-05-11 | Discharge: 2020-05-11 | Disposition: A | Discharge status: Home / Self Care | Payer: BC Managed Care – PPO | Source: Ambulatory Visit | Attending: Urology | Admitting: Urology

## 2020-05-11 DIAGNOSIS — Z20822 Contact with and (suspected) exposure to covid-19: Secondary | ICD-10-CM | POA: Insufficient documentation

## 2020-05-11 DIAGNOSIS — Z01812 Encounter for preprocedural laboratory examination: Secondary | ICD-10-CM | POA: Insufficient documentation

## 2020-05-11 LAB — SARS CORONAVIRUS 2 (TAT 6-24 HRS): SARS Coronavirus 2: NEGATIVE

## 2020-05-11 NOTE — Progress Notes (Signed)
Patient to arrive at 0800 on 05/15/20. History and medications reviewed. Pre-procedure instructions given. NPO after MN on Sunday except for clear liquids until 0600. Driver secured.

## 2020-05-15 ENCOUNTER — Ambulatory Visit (HOSPITAL_BASED_OUTPATIENT_CLINIC_OR_DEPARTMENT_OTHER)
Admission: RE | Admit: 2020-05-15 | Discharge: 2020-05-15 | Disposition: A | Discharge status: Home / Self Care | Payer: BC Managed Care – PPO | Attending: Urology | Admitting: Urology

## 2020-05-15 ENCOUNTER — Ambulatory Visit (HOSPITAL_COMMUNITY): Payer: BC Managed Care – PPO

## 2020-05-15 ENCOUNTER — Encounter (HOSPITAL_BASED_OUTPATIENT_CLINIC_OR_DEPARTMENT_OTHER)
Admission: RE | Disposition: A | Discharge status: Home / Self Care | Payer: Self-pay | Source: Home / Self Care | Attending: Urology

## 2020-05-15 ENCOUNTER — Encounter (HOSPITAL_BASED_OUTPATIENT_CLINIC_OR_DEPARTMENT_OTHER): Payer: Self-pay | Admitting: Urology

## 2020-05-15 ENCOUNTER — Other Ambulatory Visit: Payer: Self-pay

## 2020-05-15 DIAGNOSIS — Z87891 Personal history of nicotine dependence: Secondary | ICD-10-CM | POA: Insufficient documentation

## 2020-05-15 DIAGNOSIS — N200 Calculus of kidney: Secondary | ICD-10-CM | POA: Insufficient documentation

## 2020-05-15 DIAGNOSIS — Z91040 Latex allergy status: Secondary | ICD-10-CM | POA: Diagnosis not present

## 2020-05-15 HISTORY — PX: EXTRACORPOREAL SHOCK WAVE LITHOTRIPSY: SHX1557

## 2020-05-15 HISTORY — DX: Family history of other specified conditions: Z84.89

## 2020-05-15 SURGERY — LITHOTRIPSY, ESWL
Anesthesia: LOCAL | Laterality: Left

## 2020-05-15 MED ORDER — DIPHENHYDRAMINE HCL 25 MG PO CAPS
ORAL_CAPSULE | ORAL | Status: AC
  Filled 2020-05-15: qty 1

## 2020-05-15 MED ORDER — DIPHENHYDRAMINE HCL 25 MG PO CAPS
25.0000 mg | ORAL_CAPSULE | ORAL | Status: AC
  Administered 2020-05-15: 25 mg via ORAL

## 2020-05-15 MED ORDER — SODIUM CHLORIDE 0.9 % IV SOLN: INTRAVENOUS | Status: DC

## 2020-05-15 MED ORDER — DIAZEPAM 5 MG PO TABS
10.0000 mg | ORAL_TABLET | ORAL | Status: AC
  Administered 2020-05-15: 10 mg via ORAL

## 2020-05-15 MED ORDER — DOCUSATE SODIUM 100 MG PO CAPS: 100.0000 mg | ORAL_CAPSULE | Freq: Every day | ORAL | 0 refills | Status: DC | PRN

## 2020-05-15 MED ORDER — OXYCODONE-ACETAMINOPHEN 5-325 MG PO TABS: 1.0000 | ORAL_TABLET | ORAL | 0 refills | Status: DC | PRN

## 2020-05-15 MED ORDER — CEPHALEXIN 500 MG PO CAPS: 500.0000 mg | ORAL_CAPSULE | Freq: Two times a day (BID) | ORAL | 0 refills | Status: AC

## 2020-05-15 MED ORDER — TAMSULOSIN HCL 0.4 MG PO CAPS: 0.4000 mg | ORAL_CAPSULE | Freq: Every day | ORAL | 1 refills | Status: DC

## 2020-05-15 MED ORDER — CIPROFLOXACIN HCL 500 MG PO TABS
ORAL_TABLET | ORAL | Status: AC
  Filled 2020-05-15: qty 1

## 2020-05-15 MED ORDER — DIAZEPAM 5 MG PO TABS
ORAL_TABLET | ORAL | Status: AC
  Filled 2020-05-15: qty 2

## 2020-05-15 MED ORDER — CIPROFLOXACIN HCL 500 MG PO TABS
500.0000 mg | ORAL_TABLET | ORAL | Status: AC
  Administered 2020-05-15: 500 mg via ORAL

## 2020-05-15 NOTE — H&P (Signed)
Urology Preoperative H&P   Chief Complaint: Left renal stone  History of Present Illness: Veronica Pennington is a 50 y.o. female with left renal stone here for L ESWL. Denies fevers, chills, dysuria.   Past Medical History:  Diagnosis Date  . Anxiety   . Costochondritis   . Family history of adverse reaction to anesthesia    mother has nausea    Past Surgical History:  Procedure Laterality Date  . NO PAST SURGERIES      Allergies:  Allergies  Allergen Reactions  . Other Hives    "Emergen-C"  . Latex Itching and Rash    Family History  Problem Relation Age of Onset  . Rheum arthritis Mother   . Hypertension Mother   . Ankylosing spondylitis Mother   . Heart attack Father   . Hypertension Father   . Stroke Maternal Grandfather     Social History:  reports that she quit smoking about 24 years ago. She has a 10.00 pack-year smoking history. She has never used smokeless tobacco. She reports previous alcohol use. She reports that she does not use drugs.  ROS: A complete review of systems was performed.  All systems are negative except for pertinent findings as noted.  Physical Exam:  Vital signs in last 24 hours: Temp:  [97.4 F (36.3 C)] 97.4 F (36.3 C) (04/04 0839) Pulse Rate:  [52] 52 (04/04 0839) Resp:  [16] 16 (04/04 0839) BP: (116)/(81) 116/81 (04/04 0839) SpO2:  [97 %] 97 % (04/04 0839) Weight:  [77.6 kg] 77.6 kg (04/04 0839) Constitutional:  Alert and oriented, No acute distress Cardiovascular: Regular rate and rhythm Respiratory: Normal respiratory effort, Lungs clear bilaterally GI: Abdomen is soft, nontender, nondistended, no abdominal masses GU: No CVA tenderness Lymphatic: No lymphadenopathy Neurologic: Grossly intact, no focal deficits Psychiatric: Normal mood and affect  Laboratory Data:  No results for input(s): WBC, HGB, HCT, PLT in the last 72 hours.  No results for input(s): NA, K, CL, GLUCOSE, BUN, CALCIUM, CREATININE in the last 72  hours.  Invalid input(s): CO3   No results found for this or any previous visit (from the past 24 hour(s)). Recent Results (from the past 240 hour(s))  Urine Culture     Status: None   Collection Time: 05/08/20  1:24 PM   Specimen: Urine  Result Value Ref Range Status   MICRO NUMBER: [id number]  Final   SPECIMEN QUALITY: Adequate  Final   Sample Source URINE, CLEAN CATCH  Final   STATUS: FINAL  Final   Result:   Final    Mixed genital flora isolated. These superficial bacteria are not indicative of a urinary tract infection. No further organism identification is warranted on this specimen. If clinically indicated, recollect clean-catch, mid-stream urine and transfer  immediately to Urine Culture Transport Tube.   SARS CORONAVIRUS 2 (TAT 6-24 HRS) Nasopharyngeal Nasopharyngeal Swab     Status: None   Collection Time: 05/11/20  2:42 PM   Specimen: Nasopharyngeal Swab  Result Value Ref Range Status   SARS Coronavirus 2 NEGATIVE NEGATIVE Final    Comment: (NOTE) SARS-CoV-2 target nucleic acids are NOT DETECTED.  The SARS-CoV-2 RNA is generally detectable in upper and lower respiratory specimens during the acute phase of infection. Negative results do not preclude SARS-CoV-2 infection, do not rule out co-infections with other pathogens, and should not be used as the sole basis for treatment or other patient management decisions. Negative results must be combined with clinical observations, patient history, and epidemiological  information. The expected result is Negative.  Fact Sheet for Patients: HairSlick.no  Fact Sheet for Healthcare Providers: quierodirigir.com  This test is not yet approved or cleared by the [place] FDA and  has been authorized for detection and/or diagnosis of SARS-CoV-2 by FDA under an Emergency Use Authorization (EUA). This EUA will remain  in effect (meaning this test can be used) for the  duration of the COVID-19 declaration under Se ction 564(b)(1) of the Act, 21 U.S.C. section 360bbb-3(b)(1), unless the authorization is terminated or revoked sooner.  Performed at [facility], [address] [place], [place] [address]     Renal Function: No results for input(s): CREATININE in the last 168 hours. CrCl cannot be calculated (Patient's most recent lab result is older than the maximum 21 days allowed.).  Radiologic Imaging: No results found.  I independently reviewed the above imaging studies.  Assessment and Plan Veronica Pennington is a 50 y.o. female with left renal stone here for L ESWL. Denies fevers, chills, dysuria.  The risks, benefits and alternatives of Left ESWL was discussed with the patient. I described the risks which include arrhythmia, kidney contusion, kidney hemorrhage, need for transfusion, back discomfort, flank ecchymosis, flank abrasion, inability to fracture the stone, inability to pass stone fragments, Steinstrasse, infection associated with obstructing stones, need for an alternative surgical procedure and possible need for repeat shockwave lithotripsy.  The patient voices understanding and wishes to proceed.    Matt R. Leeanna Slaby MD 05/15/2020, 10:14 AM  [facility] Pager: [phone]

## 2020-05-15 NOTE — Discharge Instructions (Signed)
Lithotripsy, Care After This sheet gives you information about how to care for yourself after your procedure. Your health care provider may also give you more specific instructions. If you have problems or questions, contact your health care provider. What can I expect after the procedure? After the procedure, it is common to have: Some blood in your urine. This should only last for a few days. Soreness in your back, sides, or upper abdomen for a few days. Blotches or bruises on the area where the shock wave entered the skin. Pain, discomfort, or nausea when pieces (fragments) of the kidney stone move through the tube that carries urine from the kidney to the bladder (ureter). Stone fragments may pass soon after the procedure, but they may continue to pass for up to 4-8 weeks. If you have severe pain or nausea, contact your health care provider. This may be caused by a large stone that was not broken up, and this may mean that you need more treatment. Some pain or discomfort during urination. Some pain or discomfort in the lower abdomen or (in men) at the base of the penis. Follow these instructions at home: Medicines Take over-the-counter and prescription medicines only as told by your health care provider. If you were prescribed an antibiotic medicine, take it as told by your health care provider. Do not stop taking the antibiotic even if you start to feel better. Ask your health care provider if the medicine prescribed to you requires you to avoid driving or using machinery. Eating and drinking Drink enough fluid to keep your urine pale yellow. This helps any remaining pieces of the stone to pass. It can also help prevent new stones from forming. Eat plenty of fresh fruits and vegetables. Follow instructions from your health care provider about eating or drinking restrictions. You may be instructed to: Reduce how much salt (sodium) you eat or drink. Check ingredients and nutrition facts on  packaged foods and beverages to see how much sodium they contain. Reduce how much meat you eat. Eat the recommended amount of calcium for your age and gender. Ask your health care provider how much calcium you should have.      General instructions Get plenty of rest. Return to your normal activities as told by your health care provider. Ask your health care provider what activities are safe for you. Most people can resume normal activities 1-2 days after the procedure. If you were given a sedative during the procedure, it can affect you for several hours. Do not drive or operate machinery until your health care provider says that it is safe. Your health care provider may direct you to lie in a certain position (postural drainage) and tap firmly (percuss) over your kidney area to help stone fragments pass. Follow instructions as told by your health care provider. If directed, strain all urine through the strainer that was provided by your health care provider. Keep all fragments for your health care provider to see. Any stones that are found may be sent to a medical lab for examination. The stone may be as small as a grain of salt. Keep all follow-up visits as told by your health care provider. This is important. Contact a health care provider if: You have a fever or chills. You have nausea that is severe or does not go away. You have any of these urinary symptoms: Blood in your urine for longer than your health care provider told you to expect. Urine that smells bad or unusual.  Feeling a strong urge to urinate after emptying your bladder. Pain or burning with urination that does not go away. Urinating more often than usual and this does not go away. You have a stent and it comes out. Get help right away if: You have severe pain in your back, sides, or upper abdomen. You have any of these urinary symptoms: Severe pain while urinating. More blood in your urine or having blood in your urine  when you did not before. Passing blood clots in your urine. Passing only a small amount of urine or being unable to pass any urine at all. You have severe nausea that leads to persistent vomiting. You faint. Summary After this procedure, it is common to have some pain, discomfort, or nausea when pieces (fragments) of the kidney stone move through the tube that carries urine from the kidney to the bladder (ureter). If this pain or nausea is severe, however, you should contact your health care provider. Return to your normal activities as told by your health care provider. Ask your health care provider what activities are safe for you. Drink enough fluid to keep your urine pale yellow. This helps any remaining pieces of the stone to pass, and it can help prevent new stones from forming. If directed, strain your urine and keep all fragments for your health care provider to see. Fragments or stones may be as small as a grain of salt. Get help right away if you have severe pain in your back, sides, or upper abdomen, or if you have severe pain while urinating. This information is not intended to replace advice given to you by your health care provider. Make sure you discuss any questions you have with your health care provider. Document Revised: 11/11/2018 Document Reviewed: 11/11/2018 Elsevier Patient Education  2021 Elsevier Inc.  Activity:  You are encouraged to ambulate frequently (about every hour during waking hours) to help prevent blood clots from forming in your legs or lungs.     Diet: You should advance your diet as instructed by your physician.  It will be normal to have some bloating, nausea, and abdominal discomfort intermittently.   Prescriptions:  You will be provided a prescription for pain medication to take as needed.  If your pain is not severe enough to require the prescription pain medication, you may take extra strength Tylenol instead which will have less side effects.  You  should also take a prescribed stool softener to avoid straining with bowel movements as the prescription pain medication may constipate you.   What to call us about: You should call the office [phone]) if you develop fever > 101 or develop persistent vomiting. Activity:  You are encouraged to ambulate frequently (about every hour during waking hours) to help prevent blood clots from forming in your legs or lungs.    Followup with your PCP regarding newly diagnosed bradycardia. They may want to refer you to a cardiologist. Call the office or present to ED if you develop any worsening symptoms.

## 2020-05-15 NOTE — Op Note (Signed)
[facility] Operative Note  Treating Physician: Jettie Pagan, MD  Pre-op diagnosis: Left renal stone  Post-op diagnosis: Same   Procedure: Left ESWL  See Rojelio Brenner OP note scanned into chart. Also because of the size, density, location and other factors that cannot be anticipated I feel this will likely be a staged procedure. This fact supersedes any indication in the scanned [place] stone operative note to the contrary.  Of note, pt HR in 50s prior to start of procedure. She developed bradycardia in 40s and then to 33 during the procedure after around 1200 shocks. We elected to terminate the procedure and her heart rate returned in the 40s.   Left renal stone fragmented well despite only 1200 shocks.  I advised her to f/u with PCP. Will obtain EKG in PACU. F/u with urology in 2 weeks with KUB.   Matt R. Jahne Krukowski MD [facility]  Pager: [phone]

## 2020-05-16 ENCOUNTER — Ambulatory Visit: Payer: BC Managed Care – PPO | Admitting: Medical-Surgical

## 2020-05-16 ENCOUNTER — Encounter (HOSPITAL_BASED_OUTPATIENT_CLINIC_OR_DEPARTMENT_OTHER): Payer: Self-pay | Admitting: Urology

## 2020-05-17 ENCOUNTER — Telehealth: Payer: Self-pay | Admitting: Urology

## 2020-05-17 MED ORDER — ONDANSETRON HCL 4 MG PO TABS: 4.0000 mg | ORAL_TABLET | Freq: Four times a day (QID) | ORAL | 0 refills | Status: AC | PRN

## 2020-05-17 MED ORDER — ONDANSETRON HCL 4 MG PO TABS: 4.0000 mg | ORAL_TABLET | Freq: Every day | ORAL | 1 refills | Status: DC | PRN

## 2020-05-17 NOTE — Telephone Encounter (Signed)
Rx for zofran sent into preferred pharmacy per patient request

## 2020-06-05 ENCOUNTER — Other Ambulatory Visit: Payer: Self-pay | Admitting: Medical-Surgical

## 2020-06-22 ENCOUNTER — Encounter: Payer: Self-pay | Admitting: Medical-Surgical

## 2020-06-22 ENCOUNTER — Other Ambulatory Visit: Payer: Self-pay

## 2020-06-22 ENCOUNTER — Ambulatory Visit (INDEPENDENT_AMBULATORY_CARE_PROVIDER_SITE_OTHER): Payer: BC Managed Care – PPO | Admitting: Medical-Surgical

## 2020-06-22 VITALS — BP 108/67 | HR 51 | Temp 97.6°F | Ht 67.0 in | Wt 173.0 lb

## 2020-06-22 DIAGNOSIS — R001 Bradycardia, unspecified: Secondary | ICD-10-CM

## 2020-06-22 NOTE — Progress Notes (Signed)
Subjective:    CC: dysrhythmia  HPI: Pleasant 50 year old female presenting today for evaluation after having an episode of bradycardia during a lithotripsy last month. Her heart rate dropped to the 30s during the procedure and they had to stop partway through. She thought that the premedication they gave her was the cause. She was advised to follow up with her PCP. She scheduled the appointment but had to cancel it. Yesterday, she experienced a brief dizzy spell but it worried her so she decided to come in for evaluation. Notes that she is a sedentary individual and does not exercise regularly. Feels that she is out of shape. Does have some mild shortness of breat on exertion but attributes this to being inactive. Has had chest pain for a while but this is reproducible on palpation and is attributed to costochondritis. Infrequent episode of palpitations. Has had some intermittent nausea since the lithotripsy. Saw cardiology in the past but this has been at least 4 years ago. Denies fever, chills, diaphoresis, dyspnea at rest, and crushing/squeezing chest pain. Not taking any medications outside of sporadic vitamins/supplements.  I reviewed the past medical history, family history, social history, surgical history, and allergies today and no changes were needed.  Please see the problem list section below in epic for further details.  Past Medical History: Past Medical History:  Diagnosis Date  . Anxiety   . Costochondritis   . Family history of adverse reaction to anesthesia    mother has nausea   Past Surgical History: Past Surgical History:  Procedure Laterality Date  . EXTRACORPOREAL SHOCK WAVE LITHOTRIPSY Left 05/15/2020   Procedure: EXTRACORPOREAL SHOCK WAVE LITHOTRIPSY (ESWL);  Surgeon: Jannifer Hick, MD;  Location: [facility];  Service: Urology;  Laterality: Left;  . NO PAST SURGERIES     Social History: Social History   Socioeconomic History  . Marital status:  Widowed    Spouse name: Not on file  . Number of children: Not on file  . Years of education: Not on file  . Highest education level: Not on file  Occupational History  . Not on file  Tobacco Use  . Smoking status: Former Smoker    Packs/day: 1.00    Years: 10.00    Pack years: 10.00    Quit date: 1998    Years since quitting: 24.3  . Smokeless tobacco: Never Used  Substance and Sexual Activity  . Alcohol use: Not Currently  . Drug use: No  . Sexual activity: Not Currently  Other Topics Concern  . Not on file  Social History Narrative  . Not on file   Social Determinants of Health   Financial Resource Strain: Not on file  Food Insecurity: Not on file  Transportation Needs: Not on file  Physical Activity: Not on file  Stress: Not on file  Social Connections: Not on file   Family History: Family History  Problem Relation Age of Onset  . Rheum arthritis Mother   . Hypertension Mother   . Ankylosing spondylitis Mother   . Heart attack Father   . Hypertension Father   . Stroke Maternal Grandfather    Allergies: Allergies  Allergen Reactions  . Other Hives    "Emergen-C"  . Latex Itching and Rash   Medications: See med rec.  Review of Systems: See HPI for pertinent positives and negatives.   Objective:    General: Well Developed, well nourished, and in no acute distress.  Neuro: Alert and oriented x3, extra-ocular muscles intact,  sensation grossly intact.  HEENT: Normocephalic, atraumatic, pupils equal round reactive to light, neck supple, no masses, no lymphadenopathy, thyroid nonpalpable.  Skin: Warm and dry, no rashes. Cardiac: Regular rate and rhythm, no murmurs rubs or gallops, no lower extremity edema.  Respiratory: Clear to auscultation bilaterally. Not using accessory muscles, speaking in full sentences.  In office EKG- sinus bradycardia with rate of 43, normal axis  Impression and Recommendations:    1. Sinus bradycardia on ECG In office EKG  today. Recent blood work checked with no concerns. Referring to cardiology for further evaluation. - EKG 12-Lead - Ambulatory referral to Cardiology  Return if symptoms worsen or fail to improve. ___________________________________________ Thayer Ohm, DNP, APRN, FNP-BC Primary Care and Sports Medicine [facility] [place]

## 2020-07-17 DIAGNOSIS — N393 Stress incontinence (female) (male): Secondary | ICD-10-CM

## 2020-07-17 DIAGNOSIS — Z8489 Family history of other specified conditions: Secondary | ICD-10-CM | POA: Insufficient documentation

## 2020-07-17 HISTORY — DX: Stress incontinence (female) (male): N39.3

## 2020-07-19 ENCOUNTER — Encounter: Payer: Self-pay | Admitting: Cardiology

## 2020-07-19 ENCOUNTER — Ambulatory Visit (INDEPENDENT_AMBULATORY_CARE_PROVIDER_SITE_OTHER): Payer: BC Managed Care – PPO | Admitting: Cardiology

## 2020-07-19 ENCOUNTER — Other Ambulatory Visit: Payer: Self-pay

## 2020-07-19 ENCOUNTER — Ambulatory Visit (INDEPENDENT_AMBULATORY_CARE_PROVIDER_SITE_OTHER): Payer: BC Managed Care – PPO

## 2020-07-19 VITALS — BP 102/72 | HR 46 | Ht 68.0 in | Wt 171.0 lb

## 2020-07-19 DIAGNOSIS — E782 Mixed hyperlipidemia: Secondary | ICD-10-CM

## 2020-07-19 DIAGNOSIS — R001 Bradycardia, unspecified: Secondary | ICD-10-CM | POA: Insufficient documentation

## 2020-07-19 DIAGNOSIS — R42 Dizziness and giddiness: Secondary | ICD-10-CM | POA: Diagnosis not present

## 2020-07-19 NOTE — Patient Instructions (Signed)
Medication Instructions:  Your physician recommends that you continue on your current medications as directed. Please refer to the Current Medication list given to you today.  *If you need a refill on your cardiac medications before your next appointment, please call your pharmacy*   Lab Work: Your physician recommends that you return for lab work today: tsh, thyroid profile  If you have labs (blood work) drawn today and your tests are completely normal, you will receive your results only by: Marland Kitchen MyChart Message (if you have MyChart) OR . A paper copy in the mail If you have any lab test that is abnormal or we need to change your treatment, we will call you to review the results.   Testing/Procedures: Your physician has requested that you have an echocardiogram. Echocardiography is a painless test that uses sound waves to create images of your heart. It provides your doctor with information about the size and shape of your heart and how well your heart's chambers and valves are working. This procedure takes approximately one hour. There are no restrictions for this procedure.   A zio monitor was ordered today. It will remain on for 7 days. You will then return monitor and event diary in provided box. It takes 1-2 weeks for report to be downloaded and returned to [place]. We will call you with the results. If monitor falls off or has orange flashing light, please call Zio for further instructions.     Follow-Up: At [facility], you and your health needs are our priority.  As part of our continuing mission to provide you with exceptional heart care, we have created designated Provider Care Teams.  These Care Teams include your primary Cardiologist (physician) and Advanced Practice Providers (APPs -  Physician Assistants and Nurse Practitioners) who all work together to provide you with the care you need, when you need it.  We recommend signing up for the patient portal called "MyChart".  Sign up  information is provided on this After Visit Summary.  MyChart is used to connect with patients for Virtual Visits (Telemedicine).  Patients are able to view lab/test results, encounter notes, upcoming appointments, etc.  Non-urgent messages can be sent to your provider as well.   To learn more about what you can do with MyChart, go to [email or web address].    Your next appointment:   2 month(s)  The format for your next appointment:   In Person  Provider:   Gypsy Balsam, MD   Other Instructions   Echocardiogram An echocardiogram is a test that uses sound waves (ultrasound) to produce images of the heart. Images from an echocardiogram can provide important information about:  Heart size and shape.  The size and thickness and movement of your heart's walls.  Heart muscle function and strength.  Heart valve function or if you have stenosis. Stenosis is when the heart valves are too narrow.  If blood is flowing backward through the heart valves (regurgitation).  A tumor or infectious growth around the heart valves.  Areas of heart muscle that are not working well because of poor blood flow or injury from a heart attack.  Aneurysm detection. An aneurysm is a weak or damaged part of an artery wall. The wall bulges out from the normal force of blood pumping through the body. Tell a health care provider about:  Any allergies you have.  All medicines you are taking, including vitamins, herbs, eye drops, creams, and over-the-counter medicines.  Any blood disorders you have.  Any  surgeries you have had.  Any medical conditions you have.  Whether you are pregnant or may be pregnant. What are the risks? Generally, this is a safe test. However, problems may occur, including an allergic reaction to dye (contrast) that may be used during the test. What happens before the test? No specific preparation is needed. You may eat and drink normally. What happens during the  test?  You will take off your clothes from the waist up and put on a hospital gown.  Electrodes or electrocardiogram (ECG)patches may be placed on your chest. The electrodes or patches are then connected to a device that monitors your heart rate and rhythm.  You will lie down on a table for an ultrasound exam. A gel will be applied to your chest to help sound waves pass through your skin.  A handheld device, called a transducer, will be pressed against your chest and moved over your heart. The transducer produces sound waves that travel to your heart and bounce back (or "echo" back) to the transducer. These sound waves will be captured in real-time and changed into images of your heart that can be viewed on a video monitor. The images will be recorded on a computer and reviewed by your health care provider.  You may be asked to change positions or hold your breath for a short time. This makes it easier to get different views or better views of your heart.  In some cases, you may receive contrast through an IV in one of your veins. This can improve the quality of the pictures from your heart. The procedure may vary among health care providers and hospitals.   What can I expect after the test? You may return to your normal, everyday life, including diet, activities, and medicines, unless your health care provider tells you not to do that. Follow these instructions at home:  It is up to you to get the results of your test. Ask your health care provider, or the department that is doing the test, when your results will be ready.  Keep all follow-up visits. This is important. Summary  An echocardiogram is a test that uses sound waves (ultrasound) to produce images of the heart.  Images from an echocardiogram can provide important information about the size and shape of your heart, heart muscle function, heart valve function, and other possible heart problems.  You do not need to do anything to  prepare before this test. You may eat and drink normally.  After the echocardiogram is completed, you may return to your normal, everyday life, unless your health care provider tells you not to do that. This information is not intended to replace advice given to you by your health care provider. Make sure you discuss any questions you have with your health care provider. Document Revised: 09/21/2019 Document Reviewed: 09/21/2019 Elsevier Patient Education  2021 [employer].

## 2020-07-19 NOTE — Progress Notes (Signed)
Cardiology Consultation:    Date:  07/19/2020   ID:  Veronica Pennington, DOB 21-Jan-1971, MRN [id number]  PCP:  Christen Butter, NP  Cardiologist:  Gypsy Balsam, MD   Referring MD: Christen Butter, NP   Chief Complaint  Patient presents with  . Low HR  . Dizziness    2 months     History of Present Illness:    Veronica Pennington is a 50 y.o. female who is being seen today for the evaluation of bradycardia at the request of Christen Butter, NP.  Recently she ended up having kidney stone.  She required lithotripsy when she was having lithotripsy done she was noted to have significant bradycardia mechanism appears to be sinus and her heart rate went down to 30 actually lithotripsy have to be paused for a moment because of that.  She said always she had slow heart rate in the medevac today on the EKG her heart rate was 50.  She did not passed out recently but this does she describe some episode of dizziness sometimes when she is sitting at the computer.  She admits that she lives relatively sedentary lifestyle.  She said she worked all the time and she works while sitting in front of the computer.  She does not exercise on the regular basis. She quit smoking years ago There is no family history of premature cardiac death She is not on any special diet Last thyroid check was in 2021 August She is not sure if she got sleep apnea she does not snore she said. Her ability to exercise is limited by fatigue and shortness of breath  Past Medical History:  Diagnosis Date  . Anxiety   . Chest wall pain, chronic 11/10/2012  . Costochondritis   . Family history of adverse reaction to anesthesia    mother has nausea  . Headache 05/05/2008   Formatting of this note might be different from the original. Headache  ICD-10 cut over  . Mixed hyperlipidemia 11/11/2018  . Palpitations 11/25/2012  . Pectus excavatum 11/10/2012  . Peptic ulcer 05/05/2008   Formatting of this note might be different from the  original. Peptic Ulcer  10/1 IMO update  . Recurrent boils 10/25/2019  . Stress incontinence of urine 07/17/2020  . Vaginal discharge 02/19/2017    Past Surgical History:  Procedure Laterality Date  . EXTRACORPOREAL SHOCK WAVE LITHOTRIPSY Left 05/15/2020   Procedure: EXTRACORPOREAL SHOCK WAVE LITHOTRIPSY (ESWL);  Surgeon: Jannifer Hick, MD;  Location: [facility];  Service: Urology;  Laterality: Left;  . NO PAST SURGERIES      Current Medications: Current Meds  Medication Sig  . acetaminophen (TYLENOL) 500 MG tablet Take 500 mg by mouth every 6 (six) hours as needed for moderate pain.  Marland Kitchen ascorbic acid (VITAMIN C) 500 MG tablet Take 1 tablet by mouth daily as needed (per patient preferrence).  Marland Kitchen b complex vitamins capsule Take 1 capsule by mouth daily as needed (per patient preferrence).  Marland Kitchen ibuprofen (ADVIL,MOTRIN) 200 MG tablet Take 400 mg by mouth every 6 (six) hours as needed for headache.  Marland Kitchen MAGNESIUM PO Take 1 tablet by mouth daily as needed (per patient preferrence). Unknown strength  . Potassium Gluconate 2.5 MEQ TABS Take 1 tablet by mouth daily as needed (per patient preferrence).  Marland Kitchen VITAMIN D PO Take 1 tablet by mouth daily as needed (per patient preferrence). Unknown strentgh     Allergies:   Other and Latex   Social History  Socioeconomic History  . Marital status: Widowed    Spouse name: Not on file  . Number of children: Not on file  . Years of education: Not on file  . Highest education level: Not on file  Occupational History  . Not on file  Tobacco Use  . Smoking status: Former Smoker    Packs/day: 1.00    Years: 10.00    Pack years: 10.00    Quit date: 1998    Years since quitting: 24.4  . Smokeless tobacco: Never Used  Substance and Sexual Activity  . Alcohol use: Not Currently  . Drug use: No  . Sexual activity: Not Currently  Other Topics Concern  . Not on file  Social History Narrative  . Not on file   Social Determinants of Health    Financial Resource Strain: Not on file  Food Insecurity: Not on file  Transportation Needs: Not on file  Physical Activity: Not on file  Stress: Not on file  Social Connections: Not on file     Family History: The patient's family history includes Ankylosing spondylitis in her mother; Heart attack in her father; Hypertension in her father and mother; Rheum arthritis in her mother; Stroke in her maternal grandfather. ROS:   Please see the history of present illness.    All 14 point review of systems negative except as described per history of present illness.  EKGs/Labs/Other Studies Reviewed:    The following studies were reviewed today:   EKG:  EKG is  ordered today.  The ekg ordered today demonstrates sinus bradycardia, normal P interval, normal QS complex duration morphology  Recent Labs: 04/10/2020: ALT 21; BUN 12; Creat 0.76; Hemoglobin 13.6; Platelets 194; Potassium 4.5; Sodium 139  Recent Lipid Panel    Component Value Date/Time   CHOL 220 (H) 04/10/2020 0820   TRIG 102 04/10/2020 0820   HDL 58 04/10/2020 0820   CHOLHDL 3.8 04/10/2020 0820   LDLCALC 140 (H) 04/10/2020 0820    Physical Exam:    VS:  BP 102/72 (BP Location: Right Arm, Patient Position: Sitting)   Pulse (!) 46   Ht 5\' 8"  (1.727 m)   Wt 171 lb (77.6 kg)   LMP 02/16/2018 (Approximate)   SpO2 95%   BMI 26.00 kg/m     Wt Readings from Last 3 Encounters:  07/19/20 171 lb (77.6 kg)  06/22/20 173 lb (78.5 kg)  05/15/20 171 lb (77.6 kg)     GEN:  Well nourished, well developed in no acute distress HEENT: Normal NECK: No JVD; No carotid bruits LYMPHATICS: No lymphadenopathy CARDIAC: RRR, no murmurs, no rubs, no gallops RESPIRATORY:  Clear to auscultation without rales, wheezing or rhonchi  ABDOMEN: Soft, non-tender, non-distended MUSCULOSKELETAL:  No edema; No deformity  SKIN: Warm and dry NEUROLOGIC:  Alert and oriented x 3 PSYCHIATRIC:  Normal affect   ASSESSMENT:    1. Mixed  hyperlipidemia   2. Sinus bradycardia   3. Dizziness    PLAN:    In order of problems listed above:  1. Sinus bradycardia.  No syncope recently, but described to have some episode of dizziness.  I will ask her to wear Zio patch for a week to see how slow her heart rate goes I also asked her to press the button she have any symptomatology.  I also asked her to be active since I want to see also how she respond to exercise.  Basically when I see what her chronotropic response is.  The only  indication for pacemaker in this clinical scenario would be symptomatic sinus bradycardia and that is what we will try to determine by doing a monitor.  She in the future she may require to exercise treadmill to determine precisely chronotropic response. 2. Dizziness again decays to correlate to dizziness with bradycardia and that is what hopefully will be able to accomplish by doing the monitor.  As a part of evaluation she will also have an echocardiogram done to assess left ventricle ejection fraction, will also check her thyroid. 3. Mixed dyslipidemia.  I did review her K PN which show me her LDL of 140 HDL 48.  Not enough to start any treatment right now but 4. When she be here will calculate her 10 years risk to decide about potential treatment.   Medication Adjustments/Labs and Tests Ordered: Current medicines are reviewed at length with the patient today.  Concerns regarding medicines are outlined above.  No orders of the defined types were placed in this encounter.  No orders of the defined types were placed in this encounter.   Signed, Georgeanna Lea, MD, [facility]. 07/19/2020 9:19 AM    [facility]

## 2020-07-19 NOTE — Addendum Note (Signed)
Addended by: Hazle Quant on: 07/19/2020 09:34 AM   Modules accepted: Orders

## 2020-07-20 LAB — THYROID PANEL WITH TSH
Free Thyroxine Index: 1.8 (ref 1.2–4.9)
T3 Uptake Ratio: 26 % (ref 24–39)
T4, Total: 6.8 ug/dL (ref 4.5–12.0)
TSH: 2.08 u[IU]/mL (ref 0.450–4.500)

## 2020-08-11 ENCOUNTER — Ambulatory Visit (HOSPITAL_BASED_OUTPATIENT_CLINIC_OR_DEPARTMENT_OTHER): Payer: BC Managed Care – PPO

## 2020-08-16 ENCOUNTER — Other Ambulatory Visit: Payer: Self-pay

## 2020-08-16 ENCOUNTER — Telehealth: Payer: Self-pay

## 2020-08-16 ENCOUNTER — Ambulatory Visit (HOSPITAL_COMMUNITY)
Admission: RE | Admit: 2020-08-16 | Discharge: 2020-08-16 | Disposition: A | Discharge status: Home / Self Care | Payer: BC Managed Care – PPO | Source: Ambulatory Visit | Attending: Cardiology | Admitting: Cardiology

## 2020-08-16 DIAGNOSIS — R42 Dizziness and giddiness: Secondary | ICD-10-CM | POA: Diagnosis present

## 2020-08-16 DIAGNOSIS — R001 Bradycardia, unspecified: Secondary | ICD-10-CM | POA: Diagnosis not present

## 2020-08-16 DIAGNOSIS — E782 Mixed hyperlipidemia: Secondary | ICD-10-CM | POA: Insufficient documentation

## 2020-08-16 LAB — ECHOCARDIOGRAM COMPLETE
Area-P 1/2: 1.58 cm2
S' Lateral: 3.2 cm

## 2020-08-16 NOTE — Telephone Encounter (Signed)
-----   Message from Georgeanna Lea, MD sent at 08/16/2020 12:31 PM EDT ----- Normal monitor, 3-year rhythm strip showing normal rhythm

## 2020-08-16 NOTE — Telephone Encounter (Signed)
Spoke with patient regarding results and recommendation.  Patient verbalizes understanding and is agreeable to plan of care. Advised patient to call back with any issues or concerns.  

## 2020-08-16 NOTE — Progress Notes (Signed)
  Echocardiogram 2D Echocardiogram has been performed.  Delcie Roch 08/16/2020, 8:52 AM

## 2020-08-18 ENCOUNTER — Telehealth: Payer: Self-pay

## 2020-08-18 NOTE — Telephone Encounter (Signed)
Spoke with patient regarding results and recommendation.  Patient verbalizes understanding and is agreeable to plan of care. Advised patient to call back with any issues or concerns.  

## 2020-08-18 NOTE — Telephone Encounter (Signed)
-----   Message from Georgeanna Lea, MD sent at 08/18/2020  9:48 AM EDT ----- Echocardiogram showed preserved left ventricular ejection fraction overall looks good

## 2020-09-19 ENCOUNTER — Other Ambulatory Visit: Payer: Self-pay

## 2020-09-19 ENCOUNTER — Encounter: Payer: Self-pay | Admitting: Cardiology

## 2020-09-19 ENCOUNTER — Ambulatory Visit (INDEPENDENT_AMBULATORY_CARE_PROVIDER_SITE_OTHER): Payer: BC Managed Care – PPO | Admitting: Cardiology

## 2020-09-19 VITALS — BP 110/80 | HR 61 | Ht 67.0 in | Wt 173.0 lb

## 2020-09-19 DIAGNOSIS — R001 Bradycardia, unspecified: Secondary | ICD-10-CM

## 2020-09-19 DIAGNOSIS — R42 Dizziness and giddiness: Secondary | ICD-10-CM | POA: Diagnosis not present

## 2020-09-19 DIAGNOSIS — E782 Mixed hyperlipidemia: Secondary | ICD-10-CM | POA: Diagnosis not present

## 2020-09-19 NOTE — Progress Notes (Signed)
Cardiology Office Note:    Date:  09/19/2020   ID:  Veronica Pennington, DOB 12-30-1970, MRN [id number]  PCP:  Christen Butter, NP  Cardiologist:  Gypsy Balsam, MD    Referring MD: Christen Butter, NP   Chief Complaint  Patient presents with   Follow-up  Much better  History of Present Illness:    Veronica Pennington is a 50 y.o. female who was referred to [place] originally because of episode of sinus bradycardia that was noted while she was having lithotripsy.  Past medical history also significant for dyslipidemia, peptic ulcer disease.  We did do investigation which included echocardiogram showing structurally normal heart without significant pathology, she also had monitor which showed average heart rate of 66 no significant bradycardia.  She pressed triggered event many times which showed sinus rhythm without significant bradycardia basically it was a benign monitor. She comes today 2 months for follow-up.  About a month ago she suffered from COVID-19 she was sick for a few days but recovered completely and she has no problem associated with this event.  There is no dizziness no passing out.  She admits that she is not active.  Past Medical History:  Diagnosis Date   Anxiety    Chest wall pain, chronic 11/10/2012   Costochondritis    Family history of adverse reaction to anesthesia    mother has nausea   Headache 05/05/2008   Formatting of this note might be different from the original. Headache  ICD-10 cut over   Mixed hyperlipidemia 11/11/2018   Palpitations 11/25/2012   Pectus excavatum 11/10/2012   Peptic ulcer 05/05/2008   Formatting of this note might be different from the original. Peptic Ulcer  10/1 IMO update   Recurrent boils 10/25/2019   Stress incontinence of urine 07/17/2020   Vaginal discharge 02/19/2017    Past Surgical History:  Procedure Laterality Date   EXTRACORPOREAL SHOCK WAVE LITHOTRIPSY Left 05/15/2020   Procedure: EXTRACORPOREAL SHOCK WAVE LITHOTRIPSY (ESWL);   Surgeon: Jannifer Hick, MD;  Location: [facility];  Service: Urology;  Laterality: Left;   NO PAST SURGERIES      Current Medications: Current Meds  Medication Sig   acetaminophen (TYLENOL) 500 MG tablet Take 500 mg by mouth every 6 (six) hours as needed for moderate pain.   ascorbic acid (VITAMIN C) 500 MG tablet Take 1 tablet by mouth daily as needed (per patient preferrence).   b complex vitamins capsule Take 1 capsule by mouth daily as needed (per patient preferrence). Unknown strenght   ibuprofen (ADVIL,MOTRIN) 200 MG tablet Take 400 mg by mouth every 6 (six) hours as needed for headache.   MAGNESIUM PO Take 1 tablet by mouth daily as needed (per patient preferrence). Unknown strength   Potassium Gluconate 2.5 MEQ TABS Take 1 tablet by mouth daily as needed (per patient preferrence).   VITAMIN D PO Take 1 tablet by mouth daily as needed (per patient preferrence). Unknown strentgh     Allergies:   Other and Latex   Social History   Socioeconomic History   Marital status: Widowed    Spouse name: Not on file   Number of children: Not on file   Years of education: Not on file   Highest education level: Not on file  Occupational History   Not on file  Tobacco Use   Smoking status: Former    Packs/day: 1.00    Years: 10.00    Pack years: 10.00    Types: Cigarettes  Quit date: 22    Years since quitting: 24.6   Smokeless tobacco: Never  Substance and Sexual Activity   Alcohol use: Not Currently   Drug use: No   Sexual activity: Not Currently  Other Topics Concern   Not on file  Social History Narrative   Not on file   Social Determinants of Health   Financial Resource Strain: Not on file  Food Insecurity: Not on file  Transportation Needs: Not on file  Physical Activity: Not on file  Stress: Not on file  Social Connections: Not on file     Family History: The patient's family history includes Ankylosing spondylitis in her mother; Heart  attack in her father; Hypertension in her father and mother; Rheum arthritis in her mother; Stroke in her maternal grandfather. ROS:   Please see the history of present illness.    All 14 point review of systems negative except as described per history of present illness  EKGs/Labs/Other Studies Reviewed:      Recent Labs: 04/10/2020: ALT 21; BUN 12; Creat 0.76; Hemoglobin 13.6; Platelets 194; Potassium 4.5; Sodium 139 07/19/2020: TSH 2.080  Recent Lipid Panel    Component Value Date/Time   CHOL 220 (H) 04/10/2020 0820   TRIG 102 04/10/2020 0820   HDL 58 04/10/2020 0820   CHOLHDL 3.8 04/10/2020 0820   LDLCALC 140 (H) 04/10/2020 0820    Physical Exam:    VS:  BP 110/80 (BP Location: Right Arm, Patient Position: Sitting)   Pulse 61   Ht 5\' 7"  (1.702 m)   Wt 173 lb (78.5 kg)   LMP 02/16/2018 (Approximate)   SpO2 97%   BMI 27.10 kg/m     Wt Readings from Last 3 Encounters:  09/19/20 173 lb (78.5 kg)  07/19/20 171 lb (77.6 kg)  06/22/20 173 lb (78.5 kg)     GEN:  Well nourished, well developed in no acute distress HEENT: Normal NECK: No JVD; No carotid bruits LYMPHATICS: No lymphadenopathy CARDIAC: RRR, no murmurs, no rubs, no gallops RESPIRATORY:  Clear to auscultation without rales, wheezing or rhonchi  ABDOMEN: Soft, non-tender, non-distended MUSCULOSKELETAL:  No edema; No deformity  SKIN: Warm and dry LOWER EXTREMITIES: no swelling NEUROLOGIC:  Alert and oriented x 3 PSYCHIATRIC:  Normal affect   ASSESSMENT:    1. Sinus bradycardia   2. Mixed hyperlipidemia   3. Dizziness    PLAN:    In order of problems listed above:  Sinus bradycardia.  Nothing critical on the monitor.  We will continue monitoring ask her to let me know if she developed dizziness or passing out. Mixed dyslipidemia I did review K PN which show LDL of 140, HDL 58, and I did calculated her 10 years predicted risk which came very low at only 1%.  No need to treat however we did talk about need  to exercise as well as good diet. Dizziness that she did have multiple trigger events on monitor which showed normal sinus rhythm. Overall she is doing well encouraged her to be more active she does have a dog bite and Cleo I recommended to walk the dog on the regular basis.   Medication Adjustments/Labs and Tests Ordered: Current medicines are reviewed at length with the patient today.  Concerns regarding medicines are outlined above.  No orders of the defined types were placed in this encounter.  Medication changes: No orders of the defined types were placed in this encounter.   Signed, 08/22/20, MD, [facility] 09/19/2020 8:42 AM  [facility]

## 2020-09-19 NOTE — Patient Instructions (Addendum)

## 2021-04-03 ENCOUNTER — Ambulatory Visit: Payer: BC Managed Care – PPO | Admitting: Cardiology

## 2021-06-11 ENCOUNTER — Ambulatory Visit (INDEPENDENT_AMBULATORY_CARE_PROVIDER_SITE_OTHER): Payer: BC Managed Care – PPO | Admitting: Medical-Surgical

## 2021-06-11 ENCOUNTER — Encounter: Payer: Self-pay | Admitting: Medical-Surgical

## 2021-06-11 VITALS — BP 115/76 | HR 55 | Resp 20 | Ht 67.0 in | Wt 176.8 lb

## 2021-06-11 DIAGNOSIS — M79605 Pain in left leg: Secondary | ICD-10-CM | POA: Diagnosis not present

## 2021-06-11 DIAGNOSIS — R200 Anesthesia of skin: Secondary | ICD-10-CM

## 2021-06-11 DIAGNOSIS — M79604 Pain in right leg: Secondary | ICD-10-CM | POA: Diagnosis not present

## 2021-06-11 NOTE — Progress Notes (Signed)
?  HPI with pertinent ROS:  ? ?CC: Legs burning, toes tingling ? ?HPI: ?Pleasant 51 year old female presenting today with reports of burning in her legs and tingling in her toes that this started recently.  She does have chronic pain and notes that there are times that it is difficult to tell what is normal or not.  She has been doing a lot of sitting lately with both her job as well as travel to and from baseball games/practice.  She is very worried about the possibility of a blood clot.  She does stretches to her lower extremities regularly because she has chronic pain in her shins used to sitting in certain positions habitually.  Lately she has developed pain along the inner thigh as well as the lateral shins and in the Achilles area on the left leg.  Notes that her hips are usually tight.  She has not taken anything for her discomfort and is more worried about what may be causing it at this point.  Notes that the discomfort is not enough to keep her awake or interfere with her normal activities.  Reports that the tingling in her toes only affected the left foot but did involve all 5 digits.  No history of low back pain or lumbar spine issues.  No recent falls or injuries. ? ?I reviewed the past medical history, family history, social history, surgical history, and allergies today and no changes were needed.  Please see the problem list section below in epic for further details. ? ? ?Physical exam:  ? ?General: Well Developed, well nourished, and in no acute distress.  ?Neuro: Alert and oriented x3.  ?HEENT: Normocephalic, atraumatic.  ?Skin: Warm and dry. ?Cardiac: Regular rate and rhythm.  ?Respiratory: Not using accessory muscles, speaking in full sentences. ?MSK: No erythema, edema, or tenderness to the lower extremities that would be consistent with DVT.  Gait steady.  Muscle strength 5/5. ? ?Impression and Recommendations:   ? ?1. Bilateral leg numbness ?2. Bilateral leg pain ?Checking labs as below.   Unclear etiology for symptoms.  Consider vitamin/electrolyte imbalance.  If labs are unrevealing, consider nerve conduction study.  Discussed possible medications to address symptoms including anti-inflammatories, gabapentin, Lyrica, or SNRIs.  Patient declined medication options at this point and is more concerned with determining what the causes since the symptoms are manageable. ?- CBC with Differential ?- COMPLETE METABOLIC PANEL WITH GFR ?- Magnesium ?- Vitamin B12 ?- VITAMIN D 25 Hydroxy (Vit-D Deficiency, Fractures) ?- TSH ?- Hemoglobin A1c ? ? ?No follow-ups on file. ?___________________________________________ ?Clearnce Sorrel, DNP, APRN, FNP-BC ?Primary Care and Sports Medicine ?[place] ?

## 2021-06-12 LAB — CBC WITH DIFFERENTIAL/PLATELET
Absolute Monocytes: 371 cells/uL (ref 200–950)
Basophils Absolute: 77 cells/uL (ref 0–200)
Basophils Relative: 1.1 %
Eosinophils Absolute: 350 cells/uL (ref 15–500)
Eosinophils Relative: 5 %
HCT: 39.5 % (ref 35.0–45.0)
Hemoglobin: 13.5 g/dL (ref 11.7–15.5)
Lymphs Abs: 3101 cells/uL (ref 850–3900)
MCH: 31.1 pg (ref 27.0–33.0)
MCHC: 34.2 g/dL (ref 32.0–36.0)
MCV: 91 fL (ref 80.0–100.0)
MPV: 11.5 fL (ref 7.5–12.5)
Monocytes Relative: 5.3 %
Neutro Abs: 3101 cells/uL (ref 1500–7800)
Neutrophils Relative %: 44.3 %
Platelets: 209 10*3/uL (ref 140–400)
RBC: 4.34 10*6/uL (ref 3.80–5.10)
RDW: 12 % (ref 11.0–15.0)
Total Lymphocyte: 44.3 %
WBC: 7 10*3/uL (ref 3.8–10.8)

## 2021-06-12 LAB — VITAMIN D 25 HYDROXY (VIT D DEFICIENCY, FRACTURES): Vit D, 25-Hydroxy: 29 ng/mL — ABNORMAL LOW (ref 30–100)

## 2021-06-12 LAB — MAGNESIUM: Magnesium: 2 mg/dL (ref 1.5–2.5)

## 2021-06-12 LAB — COMPLETE METABOLIC PANEL WITH GFR
AG Ratio: 2.1 (calc) (ref 1.0–2.5)
ALT: 18 U/L (ref 6–29)
AST: 17 U/L (ref 10–35)
Albumin: 4.7 g/dL (ref 3.6–5.1)
Alkaline phosphatase (APISO): 65 U/L (ref 37–153)
BUN/Creatinine Ratio: 14 (calc) (ref 6–22)
BUN: 15 mg/dL (ref 7–25)
CO2: 25 mmol/L (ref 20–32)
Calcium: 9.6 mg/dL (ref 8.6–10.4)
Chloride: 108 mmol/L (ref 98–110)
Creat: 1.04 mg/dL — ABNORMAL HIGH (ref 0.50–1.03)
Globulin: 2.2 g/dL (calc) (ref 1.9–3.7)
Glucose, Bld: 92 mg/dL (ref 65–99)
Potassium: 3.9 mmol/L (ref 3.5–5.3)
Sodium: 142 mmol/L (ref 135–146)
Total Bilirubin: 0.5 mg/dL (ref 0.2–1.2)
Total Protein: 6.9 g/dL (ref 6.1–8.1)
eGFR: 65 mL/min/{1.73_m2} (ref >=60)

## 2021-06-12 LAB — VITAMIN B12: Vitamin B-12: 557 pg/mL (ref 200–1100)

## 2021-06-12 LAB — TSH: TSH: 3.03 mIU/L

## 2021-06-12 LAB — HEMOGLOBIN A1C
Hgb A1c MFr Bld: 5.4 % of total Hgb (ref <5.7)
Mean Plasma Glucose: 108 mg/dL
eAG (mmol/L): 6 mmol/L

## 2021-07-16 ENCOUNTER — Telehealth: Payer: Self-pay | Admitting: General Practice

## 2021-07-16 NOTE — Telephone Encounter (Signed)
Transition Care Management Unsuccessful Follow-up Telephone Call  Date of discharge and from where:  07/14/21 from [facility]  Attempts:  1st Attempt  Reason for unsuccessful TCM follow-up call:  Left voice message

## 2021-07-17 NOTE — Telephone Encounter (Signed)
Transition Care Management Unsuccessful Follow-up Telephone Call  Date of discharge and from where:  07/14/21 from [facility]  Attempts:  2nd Attempt  Reason for unsuccessful TCM follow-up call:  Left voice message

## 2021-07-18 NOTE — Telephone Encounter (Signed)
Transition Care Management Unsuccessful Follow-up Telephone Call  Date of discharge and from where:  07/14/21 from [facility]  Attempts:  3rd Attempt  Reason for unsuccessful TCM follow-up call:  Left voice message

## 2021-08-02 ENCOUNTER — Encounter: Payer: Self-pay | Admitting: Medical-Surgical

## 2021-10-10 LAB — HM PAP SMEAR: HPV, high-risk: NEGATIVE

## 2021-11-29 IMAGING — CT CT RENAL STONE PROTOCOL
2 of 4 series · 16 of 46 positions shown, 18 images · non-contrast
Comparison: None.

CLINICAL DATA: Left lower quadrant pain. Flank pain. Kidney stone
suspected. Hematuria.

EXAM:
CT ABDOMEN AND PELVIS WITHOUT CONTRAST
TECHNIQUE: Multidetector CT imaging of the abdomen and pelvis was performed
following the standard protocol without IV contrast.

[Series 2: axial st · axial · 0.69mm/px · z∈[+872,+1298]mm · 13 of 93 slices shown, 15 images]
[im 4/93  soft-tissue]
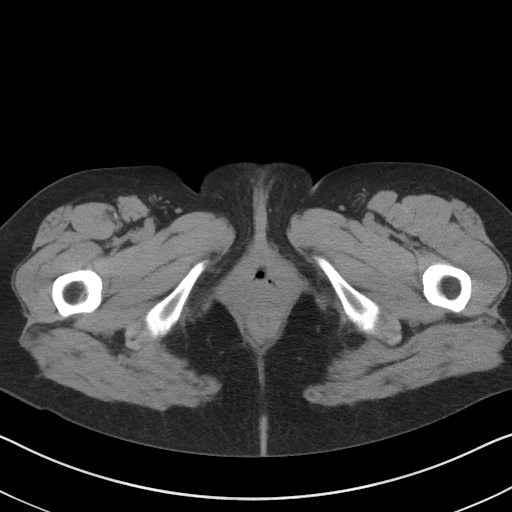
[im 4/93  bone]
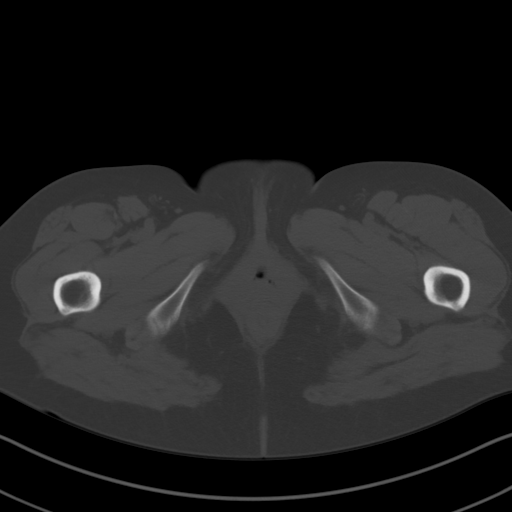
[im 12/93  soft-tissue]
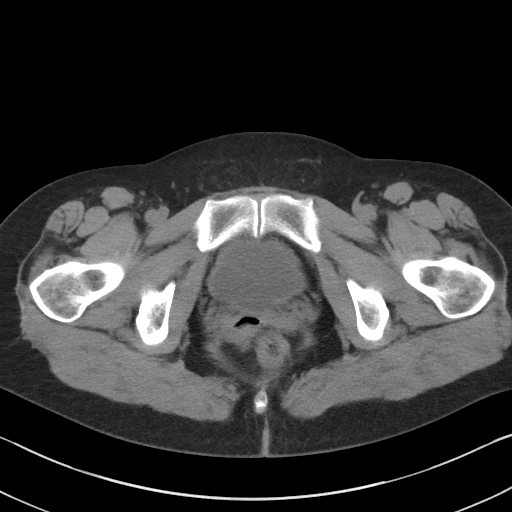
[im 19/93  soft-tissue]
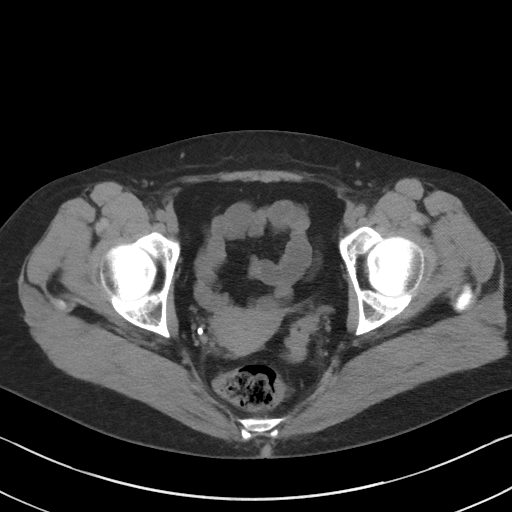
[im 26/93  soft-tissue]
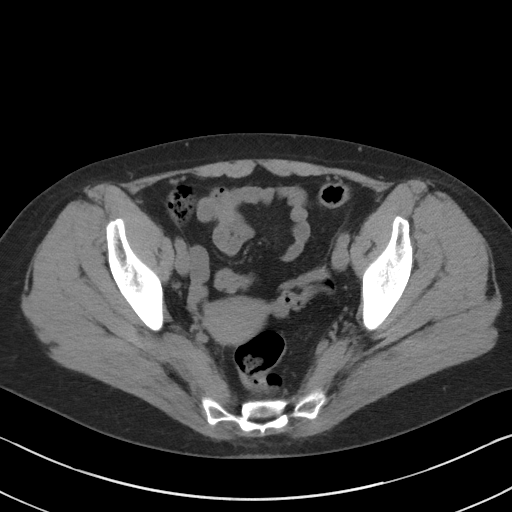
[im 34/93  soft-tissue]
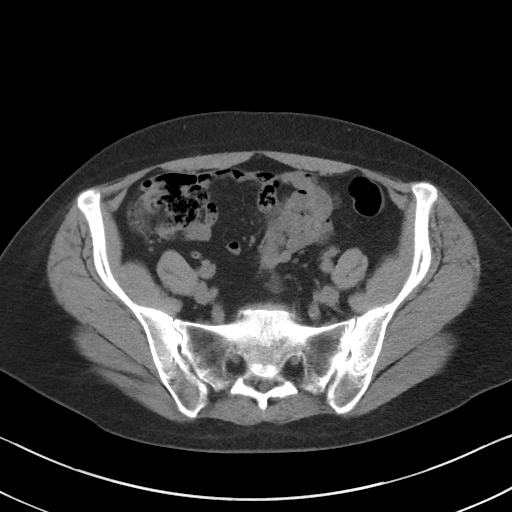
[im 41/93  soft-tissue]
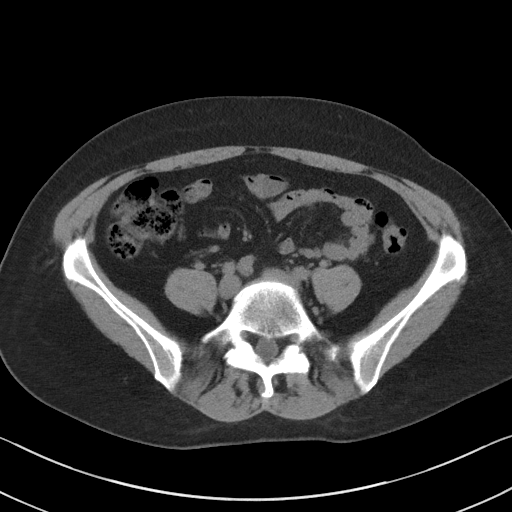
[im 48/93  soft-tissue]
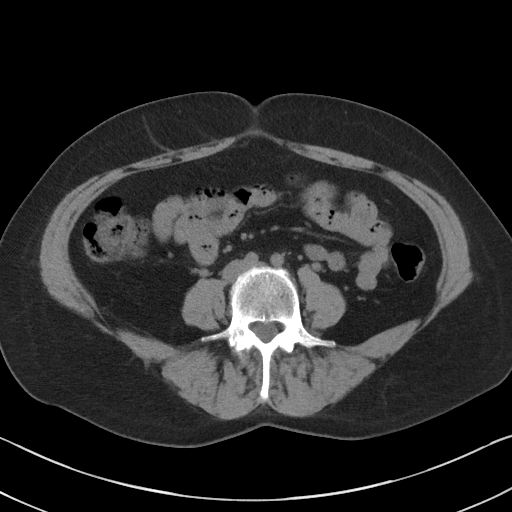
[im 52/93  soft-tissue]
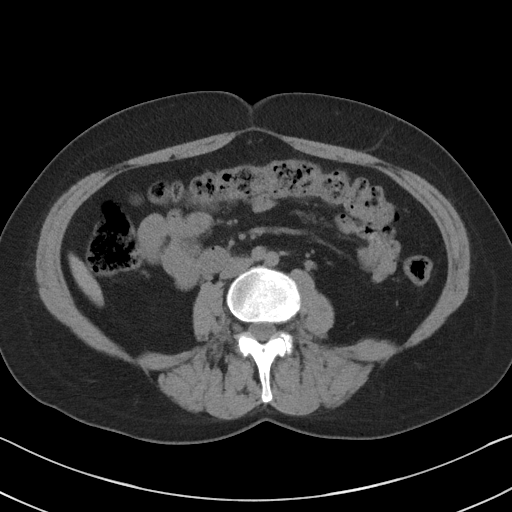
[im 59/93  soft-tissue]
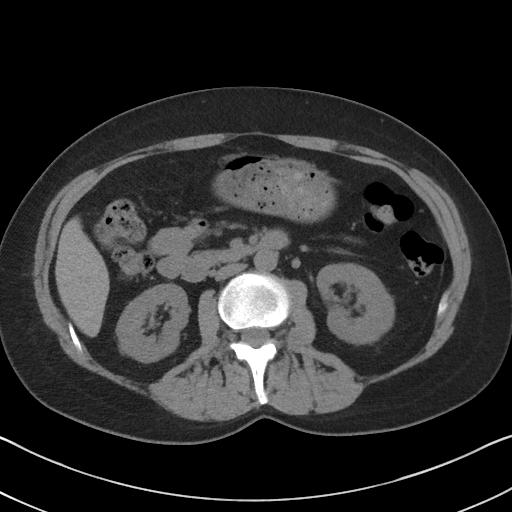
[im 59/93  bone]
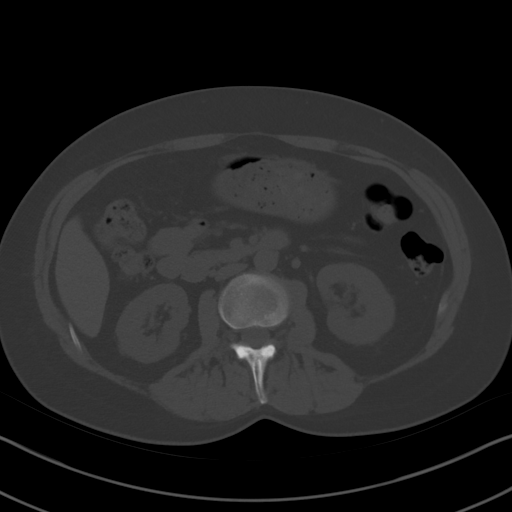
[im 67/93  soft-tissue]
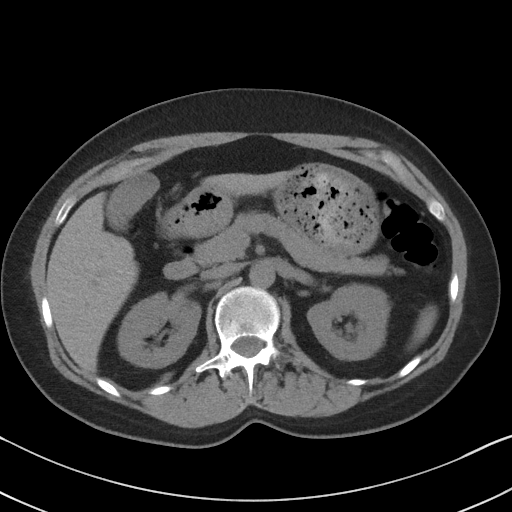
[im 74/93  soft-tissue]
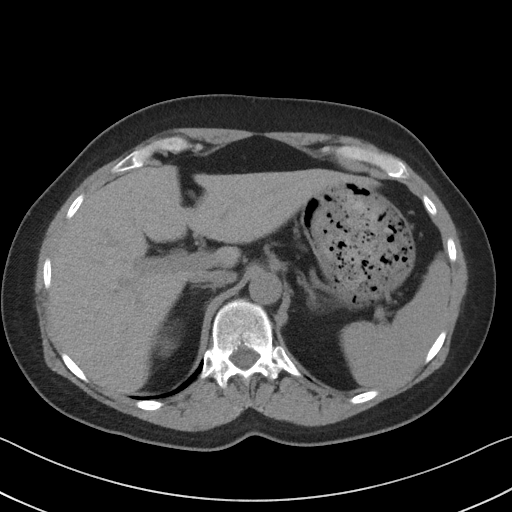
[im 81/93  soft-tissue]
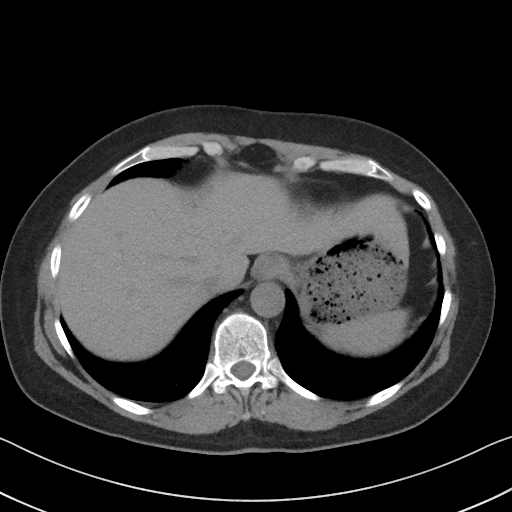
[im 89/93  soft-tissue]
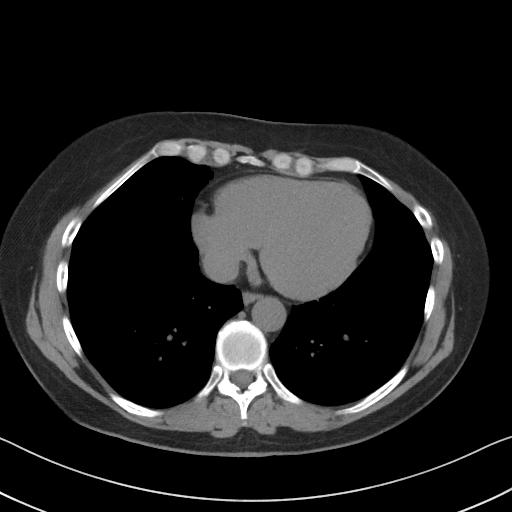

[Series 4: coronal st · coronal · 0.69mm/px · 3 of 79 slices shown]
[im 27/79  soft-tissue]
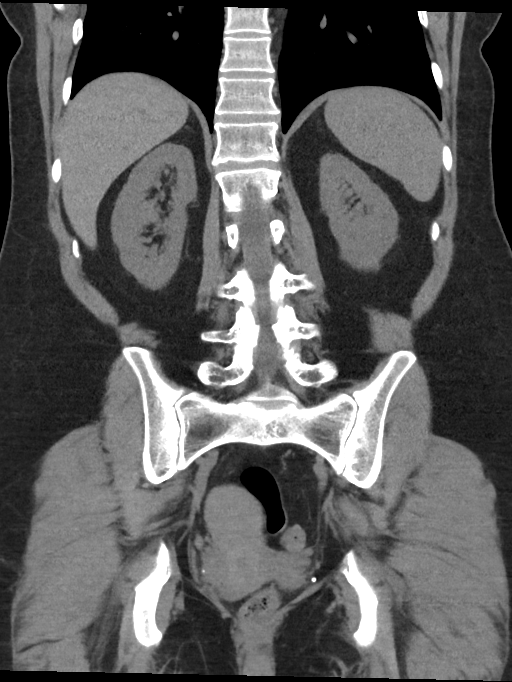
[im 35/79  soft-tissue]
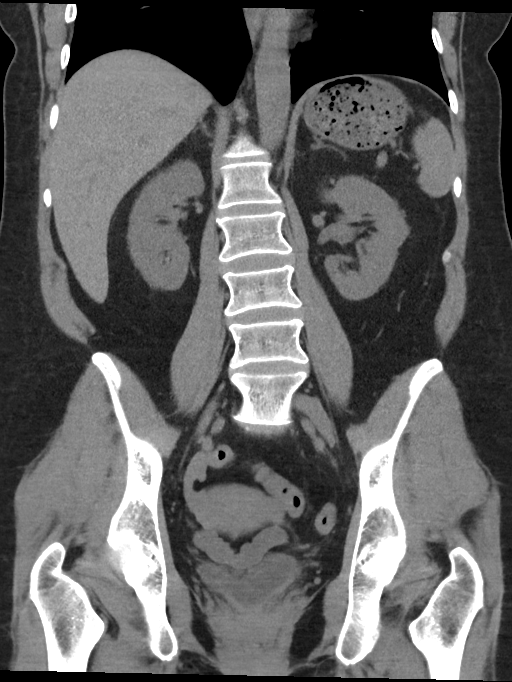
[im 44/79  soft-tissue]
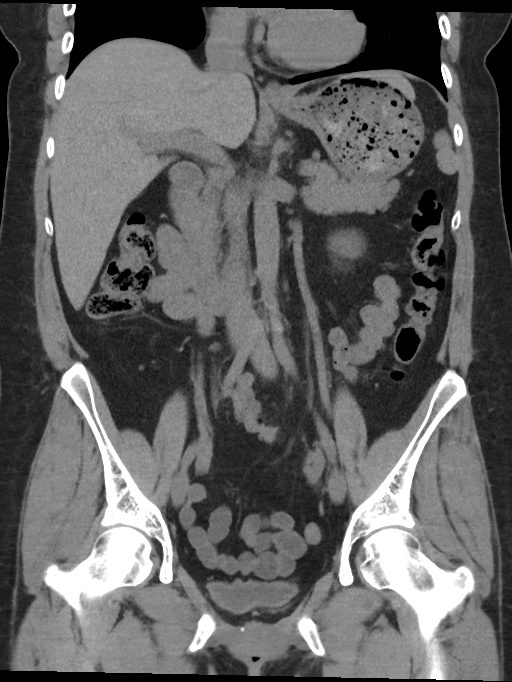

[16 of 46 positions shown; findings below may reference images not displayed]

FINDINGS: Lower chest: No acute abnormality.

Hepatobiliary: No focal liver abnormality is seen. Mildly enlarged
craniocaudal measurement of the liver, favored to reflect Bagares
lobe configuration. No gallstones, gallbladder wall thickening, or
biliary dilatation.

Pancreas: Unremarkable. No pancreatic ductal dilatation or
surrounding inflammatory changes.

Spleen: Normal in size without focal abnormality.

Adrenals/Urinary Tract: Unremarkable adrenal glands. There is an
approximately 6 by 9 x 10 mm (AP by transverse by craniocaudal)
calculus within the left renal pelvis (series 2, image 31; series 3,
image 33). No hydronephrosis. No right-sided calculi. Nondistended
ureters. Bladder is decompressed, limiting evaluation.

Stomach/Bowel: Stomach is distended with food. No evidence of
obstruction. No evidence of bowel wall thickening within the
limitation of noncontrast technique. Unremarkable appearance of the
visualized appendix.

Vascular/Lymphatic: No significant vascular findings are present. No
enlarged abdominal or pelvic lymph nodes. Calcified phleboliths in
the anatomic pelvis.

Reproductive: Uterus and bilateral adnexa are unremarkable.

Other: Small fat containing umbilical hernia. Otherwise, no
abdominal wall hernia or abnormality. No abdominopelvic ascites.

Musculoskeletal: No fracture is seen. Lower lumbar facet
arthropathy.
IMPRESSION: Approximately 10 mm calculus in the left renal pelvis, as detailed
above. No hydronephrosis.

## 2021-11-30 ENCOUNTER — Telehealth: Payer: Self-pay

## 2021-11-30 DIAGNOSIS — E782 Mixed hyperlipidemia: Secondary | ICD-10-CM

## 2021-11-30 DIAGNOSIS — Z0000 Encounter for general adult medical examination without abnormal findings: Secondary | ICD-10-CM

## 2021-11-30 NOTE — Telephone Encounter (Signed)
Routing to covering provider.   Patient stopped by the clinic today requesting annual labs for her physical. Annual labs pended for review.

## 2021-11-30 NOTE — Telephone Encounter (Signed)
Patient will stop by the clinic on Monday to have the labs completed.

## 2021-12-04 ENCOUNTER — Encounter: Payer: BC Managed Care – PPO | Admitting: Physician Assistant

## 2021-12-08 LAB — HEMOGLOBIN A1C
Hgb A1c MFr Bld: 5.6 % of total Hgb (ref <5.7)
Mean Plasma Glucose: 114 mg/dL
eAG (mmol/L): 6.3 mmol/L

## 2021-12-08 LAB — COMPLETE METABOLIC PANEL WITH GFR
AG Ratio: 2.1 (calc) (ref 1.0–2.5)
ALT: 38 U/L — ABNORMAL HIGH (ref 6–29)
AST: 24 U/L (ref 10–35)
Albumin: 4.4 g/dL (ref 3.6–5.1)
Alkaline phosphatase (APISO): 60 U/L (ref 37–153)
BUN: 16 mg/dL (ref 7–25)
CO2: 21 mmol/L (ref 20–32)
Calcium: 9 mg/dL (ref 8.6–10.4)
Chloride: 109 mmol/L (ref 98–110)
Creat: 0.88 mg/dL (ref 0.50–1.03)
Globulin: 2.1 g/dL (calc) (ref 1.9–3.7)
Glucose, Bld: 96 mg/dL (ref 65–99)
Potassium: 3.9 mmol/L (ref 3.5–5.3)
Sodium: 138 mmol/L (ref 135–146)
Total Bilirubin: 0.5 mg/dL (ref 0.2–1.2)
Total Protein: 6.5 g/dL (ref 6.1–8.1)
eGFR: 79 mL/min/{1.73_m2} (ref >=60)

## 2021-12-08 LAB — LIPID PANEL W/REFLEX DIRECT LDL
Cholesterol: 233 mg/dL — ABNORMAL HIGH (ref <200)
HDL: 66 mg/dL (ref >=50)
LDL Cholesterol (Calc): 144 mg/dL (calc) — ABNORMAL HIGH
Non-HDL Cholesterol (Calc): 167 mg/dL (calc) — ABNORMAL HIGH (ref <130)
Total CHOL/HDL Ratio: 3.5 (calc) (ref <5.0)
Triglycerides: 115 mg/dL (ref <150)

## 2021-12-08 LAB — CBC WITH DIFFERENTIAL/PLATELET
Absolute Monocytes: 380 cells/uL (ref 200–950)
Basophils Absolute: 68 cells/uL (ref 0–200)
Basophils Relative: 1.3 %
Eosinophils Absolute: 312 cells/uL (ref 15–500)
Eosinophils Relative: 6 %
HCT: 38.4 % (ref 35.0–45.0)
Hemoglobin: 13.3 g/dL (ref 11.7–15.5)
Lymphs Abs: 2215 cells/uL (ref 850–3900)
MCH: 32.2 pg (ref 27.0–33.0)
MCHC: 34.6 g/dL (ref 32.0–36.0)
MCV: 93 fL (ref 80.0–100.0)
MPV: 11.1 fL (ref 7.5–12.5)
Monocytes Relative: 7.3 %
Neutro Abs: 2226 cells/uL (ref 1500–7800)
Neutrophils Relative %: 42.8 %
Platelets: 213 10*3/uL (ref 140–400)
RBC: 4.13 10*6/uL (ref 3.80–5.10)
RDW: 12.4 % (ref 11.0–15.0)
Total Lymphocyte: 42.6 %
WBC: 5.2 10*3/uL (ref 3.8–10.8)

## 2021-12-08 LAB — VITAMIN D 25 HYDROXY (VIT D DEFICIENCY, FRACTURES): Vit D, 25-Hydroxy: 39 ng/mL (ref 30–100)

## 2021-12-10 NOTE — Progress Notes (Unsigned)
   Complete physical exam  Patient: Veronica Pennington   DOB: 06-17-1970   51 y.o. Female  MRN: [id number]  Subjective:    No chief complaint on file.   Veronica Pennington is a 51 y.o. female who presents today for a complete physical exam. She reports consuming a {diet types:17450} diet. {types:19826} She generally feels {DESC; WELL/FAIRLY WELL/POORLY:18703}. She reports sleeping {DESC; WELL/FAIRLY WELL/POORLY:18703}. She {does/does not:200015} have additional problems to discuss today.    Most recent fall risk assessment:    06/11/2021    4:08 PM  [place] in the past year? 0  Number falls in past yr: 0  Injury with Fall? 0  Risk for fall due to : No Fall Risks  Follow up Falls evaluation completed     Most recent depression screenings:    06/11/2021    4:08 PM 04/12/2020    8:27 AM  PHQ 2/9 Scores  PHQ - 2 Score 0 0    {VISON DENTAL STD PSA (Optional):27386}  {History (Optional):23778}  Patient Care Team: Samuel Bouche, NP as PCP - General (Nurse Practitioner)   Outpatient Medications Prior to Visit  Medication Sig   acetaminophen (TYLENOL) 500 MG tablet Take 500 mg by mouth every 6 (six) hours as needed for moderate pain.   ascorbic acid (VITAMIN C) 500 MG tablet Take 1 tablet by mouth daily as needed (per patient preferrence).   b complex vitamins capsule Take 1 capsule by mouth daily as needed (per patient preferrence). Unknown strenght   ibuprofen (ADVIL,MOTRIN) 200 MG tablet Take 400 mg by mouth every 6 (six) hours as needed for headache.   MAGNESIUM PO Take 1 tablet by mouth daily as needed (per patient preferrence). Unknown strength   Potassium Gluconate 2.5 MEQ TABS Take 1 tablet by mouth daily as needed (per patient preferrence).   VITAMIN D PO Take 1 tablet by mouth daily as needed (per patient preferrence). Unknown strentgh   No facility-administered medications prior to visit.    ROS        Objective:     LMP 02/16/2018  (Approximate)  {Vitals History (Optional):23777}  Physical Exam   No results found for any visits on 12/11/21. {Show previous labs (optional):23779}    Assessment & Plan:    Routine Health Maintenance and Physical Exam  Immunization History  Administered Date(s) Administered   PFIZER(Purple Top)SARS-COV-2 Vaccination 06/29/2019, 07/20/2019    Health Maintenance  Topic Date Due   HIV Screening  Never done   Hepatitis C Screening  Never done   COLONOSCOPY (Pts 45-28yrs Insurance coverage will need to be confirmed)  Never done   COVID-19 Vaccine (3 - Pfizer series) 09/14/2019   MAMMOGRAM  01/14/2021   INFLUENZA VACCINE  Never done   TETANUS/TDAP  06/12/2022 (Originally 11/03/1988)   PAP SMEAR-Modifier  03/29/2022   HPV VACCINES  Aged Out   Zoster Vaccines- Shingrix  Discontinued    Discussed health benefits of physical activity, and encouraged her to engage in regular exercise appropriate for her age and condition.  Problem List Items Addressed This Visit   None Visit Diagnoses     Annual physical exam    -  Primary   Colon cancer screening       Need for influenza vaccination          No follow-ups on file.     Samuel Bouche, NP

## 2021-12-10 NOTE — Progress Notes (Signed)
Route to PCP

## 2021-12-11 ENCOUNTER — Ambulatory Visit (INDEPENDENT_AMBULATORY_CARE_PROVIDER_SITE_OTHER): Payer: BC Managed Care – PPO | Admitting: Medical-Surgical

## 2021-12-11 ENCOUNTER — Encounter: Payer: Self-pay | Admitting: Medical-Surgical

## 2021-12-11 VITALS — BP 120/83 | HR 62 | Temp 97.5°F | Ht 67.0 in | Wt 183.0 lb

## 2021-12-11 DIAGNOSIS — Z0000 Encounter for general adult medical examination without abnormal findings: Secondary | ICD-10-CM

## 2021-12-11 DIAGNOSIS — Z1211 Encounter for screening for malignant neoplasm of colon: Secondary | ICD-10-CM

## 2021-12-11 DIAGNOSIS — Z23 Encounter for immunization: Secondary | ICD-10-CM

## 2022-04-10 ENCOUNTER — Encounter: Payer: Self-pay | Admitting: Physician Assistant

## 2022-04-10 ENCOUNTER — Ambulatory Visit (INDEPENDENT_AMBULATORY_CARE_PROVIDER_SITE_OTHER): Payer: BC Managed Care – PPO | Admitting: Physician Assistant

## 2022-04-10 VITALS — BP 128/88 | HR 76 | Ht 67.0 in | Wt 187.0 lb

## 2022-04-10 DIAGNOSIS — R531 Weakness: Secondary | ICD-10-CM | POA: Diagnosis not present

## 2022-04-10 DIAGNOSIS — F1091 Alcohol use, unspecified, in remission: Secondary | ICD-10-CM | POA: Diagnosis not present

## 2022-04-10 DIAGNOSIS — R002 Palpitations: Secondary | ICD-10-CM | POA: Diagnosis not present

## 2022-04-10 DIAGNOSIS — R195 Other fecal abnormalities: Secondary | ICD-10-CM

## 2022-04-10 LAB — POC COVID19 BINAXNOW: SARS Coronavirus 2 Ag: NEGATIVE

## 2022-04-10 NOTE — Progress Notes (Signed)
Acute Office Visit  Subjective:     Patient ID: Veronica Pennington, female    DOB: Oct 29, 1970, 52 y.o.   MRN: [id number]  Chief Complaint  Patient presents with   Dizziness   Fatigue    HPI Patient is in today for multiple symptoms of dizziness, fatigue, palpitations, and loose stools for the last few days. She does admit that she has been not drinking alcohol and had been drinking at least 2 cups of wine every night. She denies any fever, chills, body aches. Her stools are loose. Denies any melena or hematochezia. Not tried anything to make better.   .. Active Ambulatory Problems    Diagnosis Date Noted   Anxiety 10/25/2019   Costochondritis 10/25/2019   Recurrent boils 10/25/2019   Family history of adverse reaction to anesthesia    Chest wall pain, chronic 11/10/2012   Headache 05/05/2008   Mixed hyperlipidemia 11/11/2018   Palpitations 11/25/2012   Pectus excavatum 11/10/2012   Peptic ulcer 05/05/2008   Vaginal discharge 02/19/2017   Stress incontinence of urine 07/17/2020   Sinus bradycardia 07/19/2020   Dizziness 07/19/2020   Resolved Ambulatory Problems    Diagnosis Date Noted   No Resolved Ambulatory Problems   No Additional Past Medical History     ROS See HPI.     Objective:    BP 128/88 (BP Location: Left Arm, Patient Position: Sitting, Cuff Size: Normal)   Pulse 76   Ht 5\' 7"  (1.702 m)   Wt 187 lb (84.8 kg)   LMP 02/16/2018 (Approximate)   SpO2 99%   BMI 29.29 kg/m  BP Readings from Last 3 Encounters:  04/10/22 128/88  12/11/21 120/83  06/11/21 115/76   Wt Readings from Last 3 Encounters:  04/10/22 187 lb (84.8 kg)  12/11/21 183 lb (83 kg)  06/11/21 176 lb 12.8 oz (80.2 kg)    .Marland Kitchen Results for orders placed or performed in visit on 04/10/22  CBC w/Diff/Platelet  Result Value Ref Range   WBC 5.3 3.8 - 10.8 Thousand/uL   RBC 4.61 3.80 - 5.10 Million/uL   Hemoglobin 14.4 11.7 - 15.5 g/dL   HCT 42.0 35.0 - 45.0 %   MCV 91.1 80.0 -  100.0 fL   MCH 31.2 27.0 - 33.0 pg   MCHC 34.3 32.0 - 36.0 g/dL   RDW 12.1 11.0 - 15.0 %   Platelets 256 140 - 400 Thousand/uL   MPV 11.0 7.5 - 12.5 fL   Neutro Abs 2,014 1,500 - 7,800 cells/uL   Lymphs Abs 2,544 850 - 3,900 cells/uL   Absolute Monocytes 371 200 - 950 cells/uL   Eosinophils Absolute 302 15 - 500 cells/uL   Basophils Absolute 69 0 - 200 cells/uL   Neutrophils Relative % 38 %   Total Lymphocyte 48.0 %   Monocytes Relative 7.0 %   Eosinophils Relative 5.7 %   Basophils Relative 1.3 %  COMPLETE METABOLIC PANEL WITH GFR  Result Value Ref Range   Glucose, Bld 93 65 - 99 mg/dL   BUN 15 7 - 25 mg/dL   Creat 0.87 0.50 - 1.03 mg/dL   eGFR 80 > OR = 60 mL/min/1.60m2   BUN/Creatinine Ratio SEE NOTE: 6 - 22 (calc)   Sodium 138 135 - 146 mmol/L   Potassium 4.3 3.5 - 5.3 mmol/L   Chloride 105 98 - 110 mmol/L   CO2 26 20 - 32 mmol/L   Calcium 9.7 8.6 - 10.4 mg/dL   Total Protein 7.4 6.1 -  8.1 g/dL   Albumin 5.0 3.6 - 5.1 g/dL   Globulin 2.4 1.9 - 3.7 g/dL (calc)   AG Ratio 2.1 1.0 - 2.5 (calc)   Total Bilirubin 0.5 0.2 - 1.2 mg/dL   Alkaline phosphatase (APISO) 71 37 - 153 U/L   AST 19 10 - 35 U/L   ALT 31 (H) 6 - 29 U/L  TSH  Result Value Ref Range   TSH 2.34 mIU/L  Vitamin B1  Result Value Ref Range   Vitamin B1 (Thiamine) 11 8 - 30 nmol/L  Vitamin B6  Result Value Ref Range   Vitamin B6 18.1 2.1 - 21.7 ng/mL  B12 and Folate Panel  Result Value Ref Range   Vitamin B-12 574 200 - 1,100 pg/mL   Folate 23.8 ng/mL  POC COVID-19  Result Value Ref Range   SARS Coronavirus 2 Ag Negative Negative     Physical Exam Constitutional:      Appearance: Normal appearance.  HENT:     Head: Normocephalic.  Cardiovascular:     Rate and Rhythm: Normal rate and regular rhythm.  Pulmonary:     Effort: Pulmonary effort is normal.  Neurological:     General: No focal deficit present.     Mental Status: She is alert and oriented to person, place, and time.  Psychiatric:         Mood and Affect: Mood normal.          Assessment & Plan:  Marland KitchenMarland KitchenTiffany was seen today for dizziness and fatigue.  Diagnoses and all orders for this visit:  Weakness -     POC COVID-19 -     CBC w/Diff/Platelet -     COMPLETE METABOLIC PANEL WITH GFR -     TSH -     Vitamin B1 -     Vitamin B6 -     B12 and Folate Panel  Alcohol use disorder in remission -     CBC w/Diff/Platelet -     COMPLETE METABOLIC PANEL WITH GFR -     TSH -     Vitamin B1 -     Vitamin B6 -     B12 and Folate Panel  Palpitations -     CBC w/Diff/Platelet -     COMPLETE METABOLIC PANEL WITH GFR -     TSH -     Vitamin B1 -     Vitamin B6 -     B12 and Folate Panel  Loose stools -     CBC w/Diff/Platelet -     COMPLETE METABOLIC PANEL WITH GFR -     TSH -     Vitamin B1 -     Vitamin B6 -     B12 and Folate Panel   ? If symptoms are from alcohol withdrawal HO given and discussed with patient Negative for covid ? Gastroenteritis BRAT diet Vitals look good Will get labs No new medications Keep diary of symptoms and triggers Follow up as needed or if symptoms worsening or persistent     Iran Planas, PA-C

## 2022-04-10 NOTE — Patient Instructions (Signed)
Alcohol Withdrawal Syndrome Alcohol withdrawal syndrome is a group of symptoms that can develop when a person who drinks heavily and regularly stops drinking or drinks less. Alcohol withdrawal syndrome can be mild or severe, and it may even be life-threatening. Alcohol withdrawal syndrome usually affects people who have alcohol use disorder, which may also be called alcoholism. Alcohol use disorder is when a person is unable to control their alcohol use. This usually disrupts daily life. What are the causes? Drinking heavily on a regular basis cause changes in brain chemistry. Over time, the body becomes dependent on alcohol. When alcohol use stops, the chemical system in the brain becomes unbalanced and causes the symptoms of alcohol withdrawal. What increases the risk? Alcohol withdrawal syndrome is more likely to occur in people who drink more than the recommended limit of alcohol. The recommended limit is 2 drinks a day for men and 1 drink a day for women who are not pregnant. It is also more likely to affect heavy drinkers who have been using alcohol for long periods of time. The more a person drinks and the longer they drink, the greater the risk of alcohol withdrawal syndrome. The following factors may make a person more likely to develop alcohol withdrawal syndrome: Having had severe alcohol withdrawal in the past. Having had a seizure during a previous episode of alcohol withdrawal. Being elderly. Using other drugs. Having a long-term (chronic) medical problem, such as heart, lung, or liver disease. Having depression. Not getting enough nutrients from their diet (malnutrition). What are the signs or symptoms? Symptoms of this condition can be mild, moderate, or severe. Symptoms may start within 6 hours after the last drink. 48 hours after the last drink, a person may have the following symptoms: Uncontrollable shaking (tremor). Sweating. Inability to relax (agitation). Trouble sleeping  (insomnia). Irregular heartbeats (palpitations). Alcohol cravings. Seizure. The following symptoms may get worse 24-48 hours after a person decreases or stops alcohol use, then gradually improve over a period of days or weeks: Nausea and vomiting. Feeling tired (fatigue). Sensitivity to light and sounds. Confusion and inability to think clearly. Loss of appetite. Mood swings, irritability, depression, and anxiety. Insomnia and nightmares. The following symptoms are severe and life-threatening. When these symptoms occur together, they are called delirium tremens (DTs): High blood pressure. Increased heart rate. Trouble breathing. Seizures. Seeing, hearing, feeling, smelling, or tasting things that are not there (hallucinations). Delirium tremens requires immediate hospitalization. How is this diagnosed? This condition may be diagnosed based on: Your symptoms and medical history. Your history of alcohol use. Your health care provider may ask questions about your drinking behavior. It is important to be honest when you answer these questions. A psychological assessment. A physical exam. Blood tests or urine tests to measure blood alcohol level and to rule out other causes of symptoms. MRI or CT scan. This may be done if you seem to have abnormal thinking or behaviors (altered mental status). Diagnosis can be difficult. People going through withdrawal often avoid seeking medical care and are not thinking clearly. Friends and family members play an important role in recognizing symptoms and encouraging loved ones to get treatment. How is this treated? Most people with symptoms of withdrawal can be treated outside of a hospital (outpatient treatment), with close monitoring such as daily check-ins with a health care provider and counseling. You may need treatment at a hospital or treatment center (inpatient treatment) if: You have a history of delirium tremens or seizures. You have severe  symptoms.  You are addicted to other drugs. You cannot swallow medicine. You have a serious medical condition such as heart failure. You experienced withdrawal in the past but then you continued drinking alcohol. You are not likely to commit to an outpatient treatment plan. Treatment may involve: Monitoring your blood pressure, pulse, and breathing. IV fluids to keep you hydrated. Medicines to reduce withdrawal symptoms and discomfort. Medicine to reduce anxiety. Medicine to prevent or control seizures. Multivitamins and B vitamins. Having a health care provider check on you daily. It is important to get treatment for alcohol withdrawal early. Getting treatment early can: Speed up your recovery from withdrawal symptoms. Make you more likely to stop drinking for a long time (sobriety). If you need help to stop drinking, your health care provider may recommend a long-term treatment plan that includes: Medicines to help treat alcohol use disorder. Substance abuse counseling. Support groups. Follow these instructions at home:  Take over-the-counter and prescription medicines only as told by your health care provider. Do not drink alcohol. Do not drive until your health care provider approves. Have someone you trust stay with you or be available if you need help with your symptoms or with not drinking. Drink enough fluid to keep your urine pale yellow. Consider joining an alcohol support group or treatment program. This can provide emotional support, advice, and guidance. Contact a health care provider if: Your symptoms get worse instead of better. You cannot eat or drink without vomiting. You cannot stop drinking alcohol. Get help right away if: You have an irregular heartbeat. You have chest pain. You have trouble breathing. You are told you had a seizure. You hallucinate. You become very confused. These symptoms may be an emergency. Get help right away. Call 911. Do not wait to  see if the symptoms will go away. Do not drive yourself to the hospital. Summary Alcohol withdrawal is a group of symptoms that can develop when a person who drinks heavily and regularly stops drinking or drinks less. Symptoms of this condition can be mild, moderate, or severe. Treatment may include hospitalization, medicine, and counseling. This information is not intended to replace advice given to you by your health care provider. Make sure you discuss any questions you have with your health care provider. Document Revised: 04/11/2021 Document Reviewed: 04/11/2021 Elsevier Patient Education  [place].

## 2022-04-12 NOTE — Progress Notes (Signed)
Tytiana,   Glucose, kidney, liver looks good.  Electrolytes look good.  Normal hemoglobin. No anemia.  Thyroid in normal range.  B12 looks great.   Some B labs pending.  Overall labs look great!

## 2022-04-14 LAB — CBC WITH DIFFERENTIAL/PLATELET
Absolute Monocytes: 371 cells/uL (ref 200–950)
Basophils Absolute: 69 cells/uL (ref 0–200)
Basophils Relative: 1.3 %
Eosinophils Absolute: 302 cells/uL (ref 15–500)
Eosinophils Relative: 5.7 %
HCT: 42 % (ref 35.0–45.0)
Hemoglobin: 14.4 g/dL (ref 11.7–15.5)
Lymphs Abs: 2544 cells/uL (ref 850–3900)
MCH: 31.2 pg (ref 27.0–33.0)
MCHC: 34.3 g/dL (ref 32.0–36.0)
MCV: 91.1 fL (ref 80.0–100.0)
MPV: 11 fL (ref 7.5–12.5)
Monocytes Relative: 7 %
Neutro Abs: 2014 cells/uL (ref 1500–7800)
Neutrophils Relative %: 38 %
Platelets: 256 10*3/uL (ref 140–400)
RBC: 4.61 10*6/uL (ref 3.80–5.10)
RDW: 12.1 % (ref 11.0–15.0)
Total Lymphocyte: 48 %
WBC: 5.3 10*3/uL (ref 3.8–10.8)

## 2022-04-14 LAB — COMPLETE METABOLIC PANEL WITH GFR
AG Ratio: 2.1 (calc) (ref 1.0–2.5)
ALT: 31 U/L — ABNORMAL HIGH (ref 6–29)
AST: 19 U/L (ref 10–35)
Albumin: 5 g/dL (ref 3.6–5.1)
Alkaline phosphatase (APISO): 71 U/L (ref 37–153)
BUN: 15 mg/dL (ref 7–25)
CO2: 26 mmol/L (ref 20–32)
Calcium: 9.7 mg/dL (ref 8.6–10.4)
Chloride: 105 mmol/L (ref 98–110)
Creat: 0.87 mg/dL (ref 0.50–1.03)
Globulin: 2.4 g/dL (calc) (ref 1.9–3.7)
Glucose, Bld: 93 mg/dL (ref 65–99)
Potassium: 4.3 mmol/L (ref 3.5–5.3)
Sodium: 138 mmol/L (ref 135–146)
Total Bilirubin: 0.5 mg/dL (ref 0.2–1.2)
Total Protein: 7.4 g/dL (ref 6.1–8.1)
eGFR: 80 mL/min/{1.73_m2} (ref >=60)

## 2022-04-14 LAB — VITAMIN B1: Vitamin B1 (Thiamine): 11 nmol/L (ref 8–30)

## 2022-04-14 LAB — VITAMIN B6: Vitamin B6: 18.1 ng/mL (ref 2.1–21.7)

## 2022-04-14 LAB — B12 AND FOLATE PANEL
Folate: 23.8 ng/mL
Vitamin B-12: 574 pg/mL (ref 200–1100)

## 2022-04-14 LAB — TSH: TSH: 2.34 mIU/L

## 2022-04-15 ENCOUNTER — Encounter: Payer: Self-pay | Admitting: Physician Assistant

## 2022-04-15 DIAGNOSIS — R531 Weakness: Secondary | ICD-10-CM | POA: Insufficient documentation

## 2022-04-15 DIAGNOSIS — R195 Other fecal abnormalities: Secondary | ICD-10-CM | POA: Insufficient documentation

## 2022-04-15 DIAGNOSIS — F1091 Alcohol use, unspecified, in remission: Secondary | ICD-10-CM | POA: Insufficient documentation

## 2022-04-15 NOTE — Progress Notes (Signed)
B6 looks good. B1 is a little low but likely not causing any of your symptoms. Could start b1 OTC. '50mg'$  daily.

## 2022-05-29 ENCOUNTER — Encounter: Payer: Self-pay | Admitting: Medical-Surgical

## 2022-05-29 ENCOUNTER — Ambulatory Visit (INDEPENDENT_AMBULATORY_CARE_PROVIDER_SITE_OTHER): Payer: BC Managed Care – PPO | Admitting: Medical-Surgical

## 2022-05-29 VITALS — BP 120/81 | HR 61 | Temp 97.8°F | Ht 67.0 in | Wt 186.4 lb

## 2022-05-29 DIAGNOSIS — M79605 Pain in left leg: Secondary | ICD-10-CM

## 2022-05-29 DIAGNOSIS — M79604 Pain in right leg: Secondary | ICD-10-CM

## 2022-05-29 DIAGNOSIS — Z832 Family history of diseases of the blood and blood-forming organs and certain disorders involving the immune mechanism: Secondary | ICD-10-CM

## 2022-05-29 DIAGNOSIS — M25561 Pain in right knee: Secondary | ICD-10-CM

## 2022-05-29 DIAGNOSIS — R0789 Other chest pain: Secondary | ICD-10-CM | POA: Diagnosis not present

## 2022-05-29 DIAGNOSIS — F419 Anxiety disorder, unspecified: Secondary | ICD-10-CM

## 2022-05-29 DIAGNOSIS — G8929 Other chronic pain: Secondary | ICD-10-CM

## 2022-05-29 LAB — CBC WITH DIFFERENTIAL/PLATELET
HCT: 41.7 % (ref 35.0–45.0)
MCH: 30.3 pg (ref 27.0–33.0)
Platelets: 232 10*3/uL (ref 140–400)
RDW: 12.3 % (ref 11.0–15.0)

## 2022-05-29 NOTE — Progress Notes (Signed)
        Established patient visit  History, exam, impression, and plan:  1. Family history of autoimmune disorder 2. Bilateral leg pain 3. Acute pain of right knee 4. Chest wall pain, chronic Pleasant 52 year old female presenting today with continued concerns regarding musculoskeletal issues.  She does have a family history of autoimmune disorders and reports ankylosing spondylitis/spondylosis in a first-degree relative.  She is very concerned because she continues to have chronic chest wall pain that radiates around to her back.  She has intermittent issues with bilateral leg pain however her right knee has started hurting in a different way recently.  Has intermittent aches and pains throughout her extremities.  Most recently, has started having brief intermittent abrupt onset pain in the axillary region without known triggers.  Uses Tylenol/ibuprofen very sparingly with moderate relief when she does take them.  Hesitant to take medications due to concern with side effects.  Referring to physical therapy for further evaluation and management.  Labs as below to evaluate for autoimmune contributors.  To date, workup has been unable to pinpoint a cause for her individual symptoms.  At this point, consider psychosomatic effects from uncontrolled anxiety/depression, fibromyalgia, or chronic pain syndrome.  Reviewed potential use for Cymbalta.  Drug information sheet printed with AVS and provided for patient to review. - Ambulatory referral to Physical Therapy - CBC with Differential/Platelet - COMPLETE METABOLIC PANEL WITH GFR - ANA,IFA RA Diag Pnl w/rflx Tit/Patn - Sed Rate (ESR) - C-reactive protein - LUPUS(12) PANEL  5. Anxiety History of high levels of anxiety regarding health.  Notes that her stress level remains elevated and she is consistently worried about bad things happening.  Aware that anxiety can contribute to her body aches, palpitations, dizziness, etc.  Not currently on medications  or doing counseling.  Very hesitant to consider starting medication.  At this point, would like her to consider trialing Cymbalta as this would have multiple benefits for mental health as well is chronic arthralgias/myalgias.  Would like her to let me know if this is something she is willing to give a try and I will send it to her pharmacy.   Procedures performed this visit: None.  Return if symptoms worsen or fail to improve.  __________________________________ Thayer Ohm, DNP, APRN, FNP-BC Primary Care and Sports Medicine [facility] [place]

## 2022-05-30 LAB — CBC WITH DIFFERENTIAL/PLATELET
Basophils Relative: 1.5 %
Hemoglobin: 13.8 g/dL (ref 11.7–15.5)
MPV: 10.9 fL (ref 7.5–12.5)
Monocytes Relative: 6.9 %
Total Lymphocyte: 47 %

## 2022-05-30 LAB — COMPLETE METABOLIC PANEL WITH GFR
AG Ratio: 2.1 (calc) (ref 1.0–2.5)
ALT: 25 U/L (ref 6–29)
Alkaline phosphatase (APISO): 60 U/L (ref 37–153)
Creat: 0.89 mg/dL (ref 0.50–1.03)
Total Bilirubin: 0.6 mg/dL (ref 0.2–1.2)

## 2022-05-30 LAB — SEDIMENTATION RATE: Sed Rate: 2 mm/h (ref 0–30)

## 2022-06-04 NOTE — Therapy (Signed)
OUTPATIENT PHYSICAL THERAPY LOWER EXTREMITY EVALUATION   Patient Name: Veronica Pennington MRN: [id number] DOB:09-Jul-1970, 52 y.o., female Today's Date: 06/04/2022  END OF SESSION:   Past Medical History:  Diagnosis Date   Anxiety    Chest wall pain, chronic 11/10/2012   Costochondritis    Family history of adverse reaction to anesthesia    mother has nausea   Headache 05/05/2008   Formatting of this note might be different from the original. Headache  ICD-10 cut over   Mixed hyperlipidemia 11/11/2018   Palpitations 11/25/2012   Pectus excavatum 11/10/2012   Peptic ulcer 05/05/2008   Formatting of this note might be different from the original. Peptic Ulcer  10/1 IMO update   Recurrent boils 10/25/2019   Stress incontinence of urine 07/17/2020   Vaginal discharge 02/19/2017   Past Surgical History:  Procedure Laterality Date   EXTRACORPOREAL SHOCK WAVE LITHOTRIPSY Left 05/15/2020   Procedure: EXTRACORPOREAL SHOCK WAVE LITHOTRIPSY (ESWL);  Surgeon: Jannifer Hick, MD;  Location: [facility];  Service: Urology;  Laterality: Left;   NO PAST SURGERIES     Patient Active Problem List   Diagnosis Date Noted   Alcohol use disorder in remission 04/15/2022   Weakness 04/15/2022   Loose stools 04/15/2022   Sinus bradycardia 07/19/2020   Dizziness 07/19/2020   Stress incontinence of urine 07/17/2020   Family history of adverse reaction to anesthesia    Anxiety 10/25/2019   Costochondritis 10/25/2019   Recurrent boils 10/25/2019   Mixed hyperlipidemia 11/11/2018   Vaginal discharge 02/19/2017   Palpitations 11/25/2012   Chest wall pain, chronic 11/10/2012   Pectus excavatum 11/10/2012   Headache 05/05/2008   Peptic ulcer 05/05/2008    PCP: Christen Butter, NP  REFERRING PROVIDER: Christen Butter, NP  REFERRING DIAG: Acute Rt knee pain   THERAPY DIAG:  No diagnosis found.  Rationale for Evaluation and Treatment: Rehabilitation  ONSET DATE: 04/12/22  SUBJECTIVE:    SUBJECTIVE STATEMENT: Patient reports that she started having Rt knee pain in March with no known injury. Dogs ran into the outside of the Rt knee which created soreness. Now has aching and tightness in the Rt knee on a constant basis. Some times symptoms are worse than others. Most of the pain in on the medial side of the knee. Patient also has history of chronic pain in the chest wall for the past 20 years and pain in the shoulders, neck and feet and shins.  PERTINENT HISTORY: Chronic pain - neck, shoulders, chest wall, feet, shins ~ 20 years; anxiety PAIN:  Are you having pain? Yes:  PAIN:  Are you having pain? Yes: Pain location: 5 Pain description: aching and tight Aggravating factors: moving after being still Relieving factors: heat   PRECAUTIONS: None  WEIGHT BEARING RESTRICTIONS: No  FALLS:  Has patient fallen in last 6 months? Yes. Number of falls 2 dogs knocked her down once; tripped down the steps once   LIVING ENVIRONMENT: Lives with: lives with their family and lives with their son Lives in: House/apartment Stairs: Yes: Internal: 14 steps; can reach right and External: 5 steps; can reach both Has following equipment at home: None  OCCUPATION: desk/computer 8+ hours/day for 28 years; household chores; 70 yr old son; dogs; sedentary   PLOF: Independent  PATIENT GOALS: reduce pain and learn tools to prevent recurrent pain; become stronger   NEXT MD VISIT: none scheduled   OBJECTIVE:   DIAGNOSTIC FINDINGS: none available   PATIENT SURVEYS:  Not appropriate  COGNITION: Overall cognitive status: Within functional limits for tasks assessed     SENSATION: WFL  EDEMA:  No significant edema noted Rt knee   MUSCLE LENGTH: Hamstrings: Right 70 deg; Left 75 deg Thomas test: Right neutral only; Left neutral only  POSTURE: rounded shoulders, forward head, and increased lumbar lordosis  PALPATION: Muscular tightness medial Rt knee to distal hip  adductors Tightness rectus; transverse abdominals; fascia transverse abdominals/lats  Additional palpation deferred   LOWER EXTREMITY ROM: WFL's end range tightness noted Rt knee flexion no pain        Tight hamstrings; piriformis; hip adductors; ITB Rt > Lt    LOWER EXTREMITY MMT:  MMT Right eval Left eval  Hip flexion 5- 5  Hip extension 4 4  Hip abduction 4 5-  Hip adduction 4+ 5-  Hip internal rotation    Hip external rotation    Knee flexion 5 5  Knee extension 5 5  Ankle dorsiflexion    Ankle plantarflexion 5-  5  Ankle inversion    Ankle eversion     (Blank rows = not tested)  LOWER EXTREMITY SPECIAL TESTS:  Knee special tests: Anterior drawer test: negative and Posterior drawer test: negative; medial/lateral joint stress negative; patellar tracking ~ symmetrical bilat tracking laterally to medially at full extension   FUNCTIONAL TESTS:  5 times sit to stand:   GAIT: Distance walked: 40 Assistive device utilized: None Level of assistance: Complete Independence Comments: WNL's no notable limp    [facility] Adult PT Treatment:                                                DATE: 06/10/22 Therapeutic Exercise: Supine  Hamstring stretch w/strap 30 sec x 2 ITB stretch w/strap 30 sec x 1 Hip adductor stretch w/strap 30 sec x 2 Piriformis stretch travell with strap 30 sec x 2 Diaphragmatic breathing count of 6 several reps  Sitting  Piriformis stretch 30 sec x 2 Manual Therapy:  Neuromuscular re-ed: Discussion of importance of posture and alignment with function and pain management Therapeutic Activity:  Gait:  Modalities:  Self Care: Avoid sitting with LE's crossed while working at computer or when resting     PATIENT EDUCATION:  Education details: POC; HEP Person educated: Patient Education method: Programmer, multimedia, Facilities manager, Actor cues, Verbal cues, and Handouts Education comprehension: verbalized understanding, returned demonstration, verbal cues  required, tactile cues required, and needs further education  HOME EXERCISE PROGRAM: Access Code: [email or web address] URL: [email or web address] Date: 06/10/2022 Prepared by: Corlis Leak  Exercises - Hooklying Hamstring Stretch with Strap  - 2 x daily - 7 x weekly - 1 sets - 3 reps - 30 sec  hold - Supine ITB Stretch with Strap  - 2 x daily - 7 x weekly - 1 sets - 3 reps - 30 sec  hold - Hip Adductors and Hamstring Stretch with Strap  - 2 x daily - 7 x weekly - 1 sets - 3 reps - 30 sec  hold - Supine Piriformis Stretch with Leg Straight  - 2 x daily - 7 x weekly - 1 sets - 3 reps - 30 sec  hold - Hip Flexor Stretch at Edge of Bed  - 2 x daily - 7 x weekly - 1 sets - 3 reps - 30 sec  hold - Supine Diaphragmatic Breathing  - 2 x  daily - 7 x weekly - 1 sets - 10 reps - 4-6 sec  hold - Seated Hip Flexor Stretch  - 2 x daily - 7 x weekly - 1 sets - 3 reps - 30 sec  hold  Patient Education - Office Posture - Posture and Body Mechanics  ASSESSMENT:  CLINICAL IMPRESSION: Patient is a 52 y.o. female who was seen today for physical therapy evaluation and treatment for acute Rt knee pain, history of chronic pain including chest wall pain and Rt shoulder/neck pain leading to headaches. Patient has poor posture and alignment; postural habits contributing to knee pain; limited Rt > Lt mobility/tissue extensibility; muscular tightness to palpation; weakness; sedentary lifestyle. Patient will benefit from PT to address problems identified.   OBJECTIVE IMPAIRMENTS: decreased activity tolerance, decreased balance, decreased mobility, difficulty walking, decreased ROM, decreased strength, increased edema, increased fascial restrictions, impaired flexibility, improper body mechanics, postural dysfunction, and pain.   ACTIVITY LIMITATIONS: carrying, lifting, bending, standing, squatting, stairs, and locomotion level  PARTICIPATION LIMITATIONS: meal prep, cleaning, laundry, shopping, community activity,  and yard work  PERSONAL FACTORS: Past/current experiences and Time since onset of injury/illness/exacerbation are also affecting patient's functional outcome.   REHAB POTENTIAL: Good  CLINICAL DECISION MAKING: Stable/uncomplicated  EVALUATION COMPLEXITY: Low   GOALS: Goals reviewed with patient? Yes  SHORT TERM GOALS: Target date: 07/08/2022  Independent in initial HEP Baseline: Goal status: INITIAL  2.  Patient reports beginning aerobic exercises at least 3 times for week for 15-20 min  Baseline:  Goal status: INITIAL   LONG TERM GOALS: Target date: 08/05/2022   Increase tissue extensibility and mobility in bilat LE's  Baseline:  Goal status: INITIAL  2.  5/5 strength bilat LE's  Baseline:  Goal status: INITIAL  3.  Improve posture and alignment with patient demonstrating improved upright posture with posterior shoulder girdle engaged  Baseline:  Goal status: INITIAL  4.  Decrease pain in Rt knee by 50-75% allowing patient to stand and work with less pain  Baseline:  Goal status: INITIAL  5.  Decrease sensation of tightness through chest wall by 50-75% allowing patient to perform deep breath without limitation  Baseline:  Goal status: INITIAL  6.  Independent tin HEP including aquatic program as indicated  Baseline:  Goal status: INITIAL   PLAN:  PT FREQUENCY: 2x/week  PT DURATION: 8 weeks  PLANNED INTERVENTIONS: Therapeutic exercises, Therapeutic activity, Neuromuscular re-education, Balance training, Gait training, Patient/Family education, Self Care, Joint mobilization, Stair training, Aquatic Therapy, Dry Needling, Electrical stimulation, Cryotherapy, Moist heat, Taping, Ultrasound, Ionotophoresis 4mg /ml Dexamethasone, Manual therapy, and Re-evaluation  PLAN FOR NEXT SESSION: review and progress exercises; postural and body mechanics education; manual work, DN, modalities as indicated   [employer], PT 06/04/2022, 9:56 AM

## 2022-06-05 LAB — CBC WITH DIFFERENTIAL/PLATELET
Absolute Monocytes: 380 cells/uL (ref 200–950)
Basophils Absolute: 83 cells/uL (ref 0–200)
Eosinophils Absolute: 308 cells/uL (ref 15–500)
Eosinophils Relative: 5.6 %
Lymphs Abs: 2585 cells/uL (ref 850–3900)
MCHC: 33.1 g/dL (ref 32.0–36.0)
MCV: 91.4 fL (ref 80.0–100.0)
Neutro Abs: 2145 cells/uL (ref 1500–7800)
Neutrophils Relative %: 39 %
RBC: 4.56 10*6/uL (ref 3.80–5.10)
WBC: 5.5 10*3/uL (ref 3.8–10.8)

## 2022-06-05 LAB — COMPLETE METABOLIC PANEL WITH GFR
AST: 19 U/L (ref 10–35)
Albumin: 4.8 g/dL (ref 3.6–5.1)
BUN: 15 mg/dL (ref 7–25)
CO2: 24 mmol/L (ref 20–32)
Calcium: 9.9 mg/dL (ref 8.6–10.4)
Chloride: 106 mmol/L (ref 98–110)
Globulin: 2.3 g/dL (calc) (ref 1.9–3.7)
Glucose, Bld: 90 mg/dL (ref 65–99)
Potassium: 4.8 mmol/L (ref 3.5–5.3)
Sodium: 140 mmol/L (ref 135–146)
Total Protein: 7.1 g/dL (ref 6.1–8.1)
eGFR: 78 mL/min/{1.73_m2} (ref >=60)

## 2022-06-05 LAB — ANA,IFA RA DIAG PNL W/RFLX TIT/PATN
Anti Nuclear Antibody (ANA): NEGATIVE
Cyclic Citrullin Peptide Ab: <16 UNITS
Rheumatoid fact SerPl-aCnc: <10 IU/mL (ref <14)

## 2022-06-05 LAB — LUPUS(12) PANEL
Anti Nuclear Antibody (ANA): NEGATIVE
C3 Complement: 124 mg/dL (ref 83–193)
C4 Complement: 20 mg/dL (ref 15–57)
ENA SM Ab Ser-aCnc: <1 AI
Rheumatoid fact SerPl-aCnc: <10 IU/mL (ref <14)
Ribosomal P Protein Ab: <1 AI
SM/RNP: <1 AI
SSA (Ro) (ENA) Antibody, IgG: <1 AI
SSB (La) (ENA) Antibody, IgG: <1 AI
Scleroderma (Scl-70) (ENA) Antibody, IgG: <1 AI
Thyroperoxidase Ab SerPl-aCnc: 1 IU/mL (ref <9)
ds DNA Ab: <1 IU/mL

## 2022-06-05 LAB — C-REACTIVE PROTEIN: CRP: <3 mg/L (ref <8.0)

## 2022-06-10 ENCOUNTER — Encounter: Payer: Self-pay | Admitting: Rehabilitative and Restorative Service Providers"

## 2022-06-10 ENCOUNTER — Other Ambulatory Visit: Payer: Self-pay

## 2022-06-10 ENCOUNTER — Ambulatory Visit
Payer: BC Managed Care – PPO | Attending: Medical-Surgical | Admitting: Rehabilitative and Restorative Service Providers"

## 2022-06-10 DIAGNOSIS — M25561 Pain in right knee: Secondary | ICD-10-CM

## 2022-06-10 DIAGNOSIS — G8929 Other chronic pain: Secondary | ICD-10-CM | POA: Insufficient documentation

## 2022-06-10 DIAGNOSIS — M6281 Muscle weakness (generalized): Secondary | ICD-10-CM | POA: Diagnosis present

## 2022-06-10 DIAGNOSIS — R29898 Other symptoms and signs involving the musculoskeletal system: Secondary | ICD-10-CM | POA: Insufficient documentation

## 2022-06-10 DIAGNOSIS — R0789 Other chest pain: Secondary | ICD-10-CM

## 2022-06-14 ENCOUNTER — Ambulatory Visit: Payer: BC Managed Care – PPO | Attending: Medical-Surgical | Admitting: Physical Therapy

## 2022-06-14 ENCOUNTER — Encounter: Payer: Self-pay | Admitting: Physical Therapy

## 2022-06-14 DIAGNOSIS — M25561 Pain in right knee: Secondary | ICD-10-CM | POA: Diagnosis present

## 2022-06-14 DIAGNOSIS — R29898 Other symptoms and signs involving the musculoskeletal system: Secondary | ICD-10-CM | POA: Insufficient documentation

## 2022-06-14 DIAGNOSIS — R0789 Other chest pain: Secondary | ICD-10-CM | POA: Insufficient documentation

## 2022-06-14 DIAGNOSIS — M6281 Muscle weakness (generalized): Secondary | ICD-10-CM | POA: Insufficient documentation

## 2022-06-14 NOTE — Therapy (Signed)
OUTPATIENT PHYSICAL THERAPY LOWER EXTREMITY TREATMENT   Patient Name: Ordell Ureste Ruud MRN: [id number] DOB:[birth year], 52 y.o., female Today's Date: 06/14/2022  END OF SESSION:  PT End of Session - 06/14/22 0859     Visit Number 2    Number of Visits 16    Date for PT Re-Evaluation 08/05/22    Authorization Type insurance not avaiable at eval    PT Start Time 0802    PT Stop Time 0847    PT Time Calculation (min) 45 min    Activity Tolerance Patient tolerated treatment well             Past Medical History:  Diagnosis Date   Anxiety    Chest wall pain, chronic 11/10/2012   Costochondritis    Family history of adverse reaction to anesthesia    mother has nausea   Headache 05/05/2008   Formatting of this note might be different from the original. Headache  ICD-10 cut over   Mixed hyperlipidemia 11/11/2018   Palpitations 11/25/2012   Pectus excavatum 11/10/2012   Peptic ulcer 05/05/2008   Formatting of this note might be different from the original. Peptic Ulcer  10/1 IMO update   Recurrent boils 10/25/2019   Stress incontinence of urine 07/17/2020   Vaginal discharge 02/19/2017   Past Surgical History:  Procedure Laterality Date   EXTRACORPOREAL SHOCK WAVE LITHOTRIPSY Left 05/15/2020   Procedure: EXTRACORPOREAL SHOCK WAVE LITHOTRIPSY (ESWL);  Surgeon: Jannifer Hick, MD;  Location: [facility];  Service: Urology;  Laterality: Left;   NO PAST SURGERIES     Patient Active Problem List   Diagnosis Date Noted   Alcohol use disorder in remission 04/15/2022   Weakness 04/15/2022   Loose stools 04/15/2022   Sinus bradycardia 07/19/2020   Dizziness 07/19/2020   Stress incontinence of urine 07/17/2020   Family history of adverse reaction to anesthesia    Anxiety 10/25/2019   Costochondritis 10/25/2019   Recurrent boils 10/25/2019   Mixed hyperlipidemia 11/11/2018   Vaginal discharge 02/19/2017   Palpitations 11/25/2012   Chest wall pain, chronic 11/10/2012    Pectus excavatum 11/10/2012   Headache 05/05/2008   Peptic ulcer 05/05/2008    PCP: Christen Butter, NP  REFERRING PROVIDER: Christen Butter, NP  REFERRING DIAG: Acute Rt knee pain   THERAPY DIAG:  Acute pain of right knee  Other symptoms and signs involving the musculoskeletal system  Chest wall pain  Muscle weakness (generalized)  Rationale for Evaluation and Treatment: Rehabilitation  ONSET DATE: 04/12/22  SUBJECTIVE:   SUBJECTIVE STATEMENT: "I'm feeling okay. My knee has been a little tighter and aggravated this week." She has been doing exercises once a day.   From eval: Patient reports that she started having Rt knee pain in March with no known injury. Dogs ran into the outside of the Rt knee which created soreness. Now has aching and tightness in the Rt knee on a constant basis. Some times symptoms are worse than others. Most of the pain in on the medial side of the knee. Patient also has history of chronic pain in the chest wall for the past 20 years and pain in the shoulders, neck and feet and shins.  PERTINENT HISTORY: Chronic pain - neck, shoulders, chest wall, feet, shins ~ 20 years; anxiety PAIN:  Are you having pain? Yes:  PAIN:  Are you having pain? Yes: Pain location: 2 Pain description: aching and tight Aggravating factors: moving after being still Relieving factors: heat   PRECAUTIONS:  None  WEIGHT BEARING RESTRICTIONS: No  FALLS:  Has patient fallen in last 6 months? Yes. 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; dogs; sedentary   PLOF: Independent  PATIENT GOALS: reduce pain and learn tools to prevent  recurrent pain; become stronger   NEXT MD VISIT: none scheduled   OBJECTIVE:   EDEMA:  No significant edema noted Rt knee   MUSCLE LENGTH: Hamstrings: Right 70 deg; Left 75 deg Thomas test: Right neutral only; Left neutral only  POSTURE: rounded shoulders, forward head, and increased lumbar lordosis  PALPATION: Muscular tightness medial Rt knee to distal hip adductors Tightness rectus; transverse abdominals; fascia transverse abdominals/lats  Additional palpation deferred   LOWER EXTREMITY ROM: WFL's end range tightness noted Rt knee flexion no pain        Tight hamstrings; piriformis; hip adductors; ITB Rt > Lt    LOWER EXTREMITY MMT:  MMT Right eval Left eval  Hip flexion 5- 5  Hip extension 4 4  Hip abduction 4 5-  Hip adduction 4+ 5-  Hip internal rotation    Hip external rotation    Knee flexion 5 5  Knee extension 5 5  Ankle dorsiflexion    Ankle plantarflexion 5-  5  Ankle inversion    Ankle eversion     (Blank rows = not tested)  LOWER EXTREMITY SPECIAL TESTS:  Knee special tests: Anterior drawer test: negative and Posterior drawer test: negative; medial/lateral joint stress negative; patellar tracking ~ symmetrical bilat tracking laterally to medially at full extension   FUNCTIONAL TESTS:  5 times sit to stand:   GAIT: Distance walked: 40 Assistive device utilized: None Level of assistance: Complete Independence Comments: WNL's no notable limp    [facility] Adult PT Treatment:                                                DATE: 06/14/22 Therapeutic Exercise: Nustep L6 x 5 min LEs only Supine Hamstring stretch with strap x30 sec ITB stretch with strap x30 sec Hip adductor stretch with strap x30 sec Hip flexor stretch x30 sec SLR + ER 2x10 SAQ + ball squeeze 2x10x3 sec Bridge + ball squeeze 2x10x3 sec Deep knee flexion hamstring setting 10x5 sec Prone  Quad stretch with strap x30 sec S/L Hip adduction 2x10 Hip abduction 2x10 Quadruped Knee  mobilization with towel in bend coming into child's pose x10 Manual Therapy: IASTM adductors, VMO, medial hamstring Grade II to III patellar and knee mobilization for flexion   [facility] Adult PT Treatment:                                                DATE: 06/10/22 Therapeutic Exercise: Supine  Hamstring stretch w/strap 30 sec x 2 ITB stretch w/strap 30 sec x 1 Hip adductor stretch w/strap 30 sec x 2 Piriformis stretch travell with strap 30 sec x  2 Diaphragmatic breathing count of 6 several reps  Sitting  Piriformis stretch 30 sec x 2 Neuromuscular re-ed: Discussion of importance of posture and alignment with function and pain management Self Care: Avoid sitting with LE's crossed while working at computer or when resting     PATIENT EDUCATION:  Education details: POC; HEP Person educated: Patient Education method: Programmer, multimedia, Facilities manager, Actor cues, Verbal cues, and Handouts Education comprehension: verbalized understanding, returned demonstration, verbal cues required, tactile cues required, and needs further education  HOME EXERCISE PROGRAM: Access Code: [email or web address] URL: [email or web address] Date: 06/10/2022 Prepared by: Corlis Leak  Exercises - Hooklying Hamstring Stretch with Strap  - 2 x daily - 7 x weekly - 1 sets - 3 reps - 30 sec  hold - Supine ITB Stretch with Strap  - 2 x daily - 7 x weekly - 1 sets - 3 reps - 30 sec  hold - Hip Adductors and Hamstring Stretch with Strap  - 2 x daily - 7 x weekly - 1 sets - 3 reps - 30 sec  hold - Supine Piriformis Stretch with Leg Straight  - 2 x daily - 7 x weekly - 1 sets - 3 reps - 30 sec  hold - Hip Flexor Stretch at Edge of Bed  - 2 x daily - 7 x weekly - 1 sets - 3 reps - 30 sec  hold - Supine Diaphragmatic Breathing  - 2 x daily - 7 x weekly - 1 sets - 10 reps - 4-6 sec  hold - Seated Hip Flexor Stretch  - 2 x daily - 7 x weekly - 1 sets - 3 reps - 30 sec  hold  Patient Education - Office Posture - Posture  and Body Mechanics  ASSESSMENT:  CLINICAL IMPRESSION: Reviewed pt's stretches. Worked primarily on initiating strengthening regimen mostly focusing on medial knee (VMO, adductors, semimembranosus). Knee pain is most notable at end range of knee flexion with pressure (did not complain of any issues when performing prone quad stretch with strap).   From eval: Patient is a 52 y.o. female who was seen today for physical therapy evaluation and treatment for acute Rt knee pain, history of chronic pain including chest wall pain and Rt shoulder/neck pain leading to headaches. Patient has poor posture and alignment; postural habits contributing to knee pain; limited Rt > Lt mobility/tissue extensibility; muscular tightness to palpation; weakness; sedentary lifestyle. Patient will benefit from PT to address problems identified.   OBJECTIVE IMPAIRMENTS: decreased activity tolerance, decreased balance, decreased mobility, difficulty walking, decreased ROM, decreased strength, increased edema, increased fascial restrictions, impaired flexibility, improper body mechanics, postural dysfunction, and pain.    GOALS: Goals reviewed with patient? Yes  SHORT TERM GOALS: Target date: 07/08/2022  Independent in initial HEP Baseline: Goal status: INITIAL  2.  Patient reports beginning aerobic exercises at least 3 times for week for 15-20 min  Baseline:  Goal status: INITIAL   LONG TERM GOALS: Target date: 08/05/2022   Increase tissue extensibility and mobility in bilat LE's  Baseline:  Goal status: INITIAL  2.  5/5 strength bilat LE's  Baseline:  Goal status: INITIAL  3.  Improve posture and alignment with patient demonstrating improved upright posture with posterior shoulder girdle engaged  Baseline:  Goal status: INITIAL  4.  Decrease pain in Rt knee by 50-75% allowing patient to stand and work with less pain  Baseline:  Goal status: INITIAL  5.  Decrease sensation of tightness through chest  wall by 50-75% allowing patient  to perform deep breath without limitation  Baseline:  Goal status: INITIAL  6.  Independent tin HEP including aquatic program as indicated  Baseline:  Goal status: INITIAL   PLAN:  PT FREQUENCY: 2x/week  PT DURATION: 8 weeks  PLANNED INTERVENTIONS: Therapeutic exercises, Therapeutic activity, Neuromuscular re-education, Balance training, Gait training, Patient/Family education, Self Care, Joint mobilization, Stair training, Aquatic Therapy, Dry Needling, Electrical stimulation, Cryotherapy, Moist heat, Taping, Ultrasound, Ionotophoresis 4mg /ml Dexamethasone, Manual therapy, and Re-evaluation  PLAN FOR NEXT SESSION: review and progress exercises; postural and body mechanics education; manual work, DN, modalities as indicated   Natayla Cadenhead April Ma L Artina Minella, PT 06/14/2022, 9:00 AM

## 2022-06-19 ENCOUNTER — Ambulatory Visit: Payer: BC Managed Care – PPO

## 2022-06-19 DIAGNOSIS — M25561 Pain in right knee: Secondary | ICD-10-CM | POA: Diagnosis not present

## 2022-06-19 DIAGNOSIS — R29898 Other symptoms and signs involving the musculoskeletal system: Secondary | ICD-10-CM

## 2022-06-19 DIAGNOSIS — R0789 Other chest pain: Secondary | ICD-10-CM

## 2022-06-19 DIAGNOSIS — M6281 Muscle weakness (generalized): Secondary | ICD-10-CM

## 2022-06-19 NOTE — Therapy (Signed)
OUTPATIENT PHYSICAL THERAPY LOWER EXTREMITY TREATMENT   Patient Name: Veronica Pennington Legner MRN: [id number] DOB:25-Apr-1970, 52 y.o., female Today's Date: 06/19/2022  END OF SESSION:  PT End of Session - 06/19/22 0758     Visit Number 3    Number of Visits 16    Date for PT Re-Evaluation 08/05/22    Authorization Type [employer]    PT Start Time 0800    PT Stop Time 0842    PT Time Calculation (min) 42 min    Activity Tolerance Patient tolerated treatment well    Behavior During Therapy [facility] for tasks assessed/performed             Past Medical History:  Diagnosis Date   Anxiety    Chest wall pain, chronic 11/10/2012   Costochondritis    Family history of adverse reaction to anesthesia    mother has nausea   Headache 05/05/2008   Formatting of this note might be different from the original. Headache  ICD-10 cut over   Mixed hyperlipidemia 11/11/2018   Palpitations 11/25/2012   Pectus excavatum 11/10/2012   Peptic ulcer 05/05/2008   Formatting of this note might be different from the original. Peptic Ulcer  10/1 IMO update   Recurrent boils 10/25/2019   Stress incontinence of urine 07/17/2020   Vaginal discharge 02/19/2017   Past Surgical History:  Procedure Laterality Date   EXTRACORPOREAL SHOCK WAVE LITHOTRIPSY Left 05/15/2020   Procedure: EXTRACORPOREAL SHOCK WAVE LITHOTRIPSY (ESWL);  Surgeon: Jannifer Hick, MD;  Location: [facility];  Service: Urology;  Laterality: Left;   NO PAST SURGERIES     Patient Active Problem List   Diagnosis Date Noted   Alcohol use disorder in remission 04/15/2022   Weakness 04/15/2022   Loose stools 04/15/2022   Sinus bradycardia 07/19/2020   Dizziness 07/19/2020   Stress incontinence of urine 07/17/2020   Family history of adverse reaction to anesthesia    Anxiety 10/25/2019   Costochondritis 10/25/2019   Recurrent boils 10/25/2019   Mixed hyperlipidemia 11/11/2018   Vaginal discharge 02/19/2017   Palpitations 11/25/2012    Chest wall pain, chronic 11/10/2012   Pectus excavatum 11/10/2012   Headache 05/05/2008   Peptic ulcer 05/05/2008    PCP: Christen Butter, NP  REFERRING PROVIDER: Christen Butter, NP  REFERRING DIAG: Acute Rt knee pain   THERAPY DIAG:  Acute pain of right knee  Other symptoms and signs involving the musculoskeletal system  Chest wall pain  Muscle weakness (generalized)  Rationale for Evaluation and Treatment: Rehabilitation  ONSET DATE: 04/12/22  SUBJECTIVE:   SUBJECTIVE STATEMENT: Patient reports her medial knee is tender today, states she continues to have knee pain with transition from sitting to standing. Patient states she noticed no knee pain during tree pose in yoga.   From eval: Patient reports that she started having Rt knee pain in March with no known injury. Dogs ran into the outside of the Rt knee which created soreness. Now has aching and tightness in the Rt knee on a constant basis. Some times symptoms are worse than others. Most of the pain in on the medial side of the knee. Patient also has history of chronic pain in the chest wall for the past 20 years and pain in the shoulders, neck and feet and shins.  PERTINENT HISTORY: Chronic pain - neck, shoulders, chest wall, feet, shins ~ 20 years; anxiety PAIN:  Are you having pain? Yes:  PAIN:  Are you having pain? Yes: Pain location: 2  Pain description: aching and tight Aggravating factors: moving after being still Relieving factors: heat   PRECAUTIONS: None  WEIGHT BEARING RESTRICTIONS: No  FALLS:  Has patient fallen in last 6 months? Yes. 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; dogs; sedentary   PLOF: Independent  PATIENT GOALS: reduce pain and learn tools to prevent recurrent pain; become stronger   NEXT MD VISIT: none scheduled   OBJECTIVE:   EDEMA:  No significant edema noted Rt knee   MUSCLE LENGTH: Hamstrings: Right 70 deg; Left 75 deg Thomas test: Right neutral only; Left neutral only  POSTURE: rounded shoulders, forward head, and increased lumbar lordosis  PALPATION: Muscular tightness medial Rt knee to distal hip adductors Tightness rectus; transverse abdominals; fascia transverse abdominals/lats  Additional palpation deferred   LOWER EXTREMITY ROM: WFL's end range tightness noted Rt knee flexion no pain        Tight hamstrings; piriformis; hip adductors; ITB Rt > Lt    LOWER EXTREMITY MMT:  MMT Right eval Left eval  Hip flexion 5- 5  Hip extension 4 4  Hip abduction 4 5-  Hip adduction 4+ 5-  Hip internal rotation    Hip external rotation    Knee flexion 5 5  Knee extension 5 5  Ankle dorsiflexion    Ankle plantarflexion 5-  5  Ankle inversion    Ankle eversion     (Blank rows = not tested)  LOWER EXTREMITY SPECIAL TESTS:  Knee special tests: Anterior drawer test: negative and Posterior drawer test: negative; medial/lateral joint stress negative; patellar tracking ~ symmetrical bilat tracking laterally to medially at full extension   FUNCTIONAL TESTS:  5 times sit to stand:   GAIT: Distance walked: 40 Assistive device utilized: None Level of assistance: Complete Independence Comments: WNL's no notable limp    [facility] Adult PT Treatment:                                                DATE: 06/19/2022 Therapeutic Exercise: Recumbent bike L5 x 5 min HS/ITB/ADD stretches w/strap x30" each 3-way HS stretch x30" each SAQ + ball squeeze (ankles) 3x10x3" Small range SLR in ER 12x5" Hooklying hip add ball squeezes 10x5"  Thomas stretch off end of table --> standing hip flexor stretch Bridges + ball squeeze x12  --> figure 4 bridge x10 Supine R glute stretch Seated LAQ + ball squeeze 10x3" Seated LAQ + ball b/w ankles x10 Straddle (add) stretch S/L add leg raises --> added pulses --> added small circles  S/L front/back leg kicks --> IR leg raises in hip ext --> front/back heel taps in ER Seated figure 4 stretch   [facility] Adult PT Treatment:                                                DATE: 06/14/22 Therapeutic Exercise: Nustep L6 x 5 min  LEs only Supine Hamstring stretch with strap x30 sec ITB stretch with strap x30 sec Hip adductor stretch with strap x30 sec Hip flexor stretch x30 sec SLR + ER 2x10 SAQ + ball squeeze 2x10x3 sec Bridge + ball squeeze 2x10x3 sec Deep knee flexion hamstring setting 10x5 sec Prone  Quad stretch with strap x30 sec S/L Hip adduction 2x10 Hip abduction 2x10 Quadruped Knee mobilization with towel in bend coming into child's pose x10 Manual Therapy: IASTM adductors, VMO, medial hamstring Grade II to III patellar and knee mobilization for flexion   PATIENT EDUCATION:  Education details: Updated HEP Person educated: Patient Education method: Explanation, Demonstration, Tactile cues, Verbal cues, and Handouts Education comprehension: verbalized understanding, returned demonstration, verbal cues required, tactile cues required, and needs further education  HOME EXERCISE PROGRAM: Access Code: [email or web address] URL: [email or web address] Date: 06/19/2022 Prepared by: Carlynn Herald  Exercises - Hooklying Hamstring Stretch with Strap  - 2 x daily - 7 x weekly - 1 sets - 3 reps - 30 sec  hold - Supine ITB Stretch with Strap  - 2 x daily - 7 x weekly - 1 sets - 3 reps - 30 sec  hold - Hip Adductors and Hamstring Stretch with Strap  - 2 x daily - 7 x weekly - 1 sets - 3 reps - 30 sec  hold - Supine Piriformis Stretch with Leg Straight  - 2 x daily - 7 x weekly - 1 sets - 3 reps - 30 sec  hold - Hip Flexor Stretch at Edge of Bed  - 2 x daily - 7 x weekly -  1 sets - 3 reps - 30 sec  hold - Supine Diaphragmatic Breathing  - 2 x daily - 7 x weekly - 1 sets - 10 reps - 4-6 sec  hold - Seated Hip Flexor Stretch  - 2 x daily - 7 x weekly - 1 sets - 3 reps - 30 sec  hold - Short Arc Quad with [employer]  - 1 x daily - 7 x weekly - 2 sets - 10 reps - Long Sitting Hamstring Set  - 1 x daily - 7 x weekly - 2 sets - 10 reps - Sidelying Hip Adduction  - 1 x daily - 7 x weekly - 2 sets - 10 reps - Supine Bridge with Mini Swiss Ball Between Knees  - 1 x daily - 7 x weekly - 3 sets - 10 reps - Figure 4 Bridge  - 1 x daily - 7 x weekly - 3 sets - 10 reps - Seated Long Arc Quad with Hip Adduction  - 1 x daily - 7 x weekly - 3 sets - 10 reps  ASSESSMENT:  CLINICAL IMPRESSION: Continued adductor strengthening with focus on VMO activation. Standing version of hip flexor stretch provided for HEP modification.   From eval: Patient is a 52 y.o. female who was seen today for physical therapy evaluation and treatment for acute Rt knee pain, history of chronic pain including chest wall pain and Rt shoulder/neck pain leading to headaches. Patient has poor posture and alignment; postural habits contributing to knee pain; limited Rt > Lt mobility/tissue extensibility; muscular tightness to palpation; weakness; sedentary lifestyle. Patient will benefit from PT to address problems identified.   OBJECTIVE IMPAIRMENTS: decreased activity tolerance, decreased balance, decreased mobility, difficulty walking, decreased ROM, decreased strength, increased edema, increased fascial restrictions, impaired flexibility, improper body mechanics, postural dysfunction, and pain.    GOALS: Goals reviewed with patient? Yes  SHORT  TERM GOALS: Target date: 07/08/2022  Independent in initial HEP Baseline: Goal status: INITIAL  2.  Patient reports beginning aerobic exercises at least 3 times for week for 15-20 min  Baseline:  Goal status: INITIAL   LONG TERM GOALS: Target date:  08/05/2022   Increase tissue extensibility and mobility in bilat LE's  Baseline:  Goal status: INITIAL  2.  5/5 strength bilat LE's  Baseline:  Goal status: INITIAL  3.  Improve posture and alignment with patient demonstrating improved upright posture with posterior shoulder girdle engaged  Baseline:  Goal status: INITIAL  4.  Decrease pain in Rt knee by 50-75% allowing patient to stand and work with less pain  Baseline:  Goal status: INITIAL  5.  Decrease sensation of tightness through chest wall by 50-75% allowing patient to perform deep breath without limitation  Baseline:  Goal status: INITIAL  6.  Independent tin HEP including aquatic program as indicated  Baseline:  Goal status: INITIAL   PLAN:  PT FREQUENCY: 2x/week  PT DURATION: 8 weeks  PLANNED INTERVENTIONS: Therapeutic exercises, Therapeutic activity, Neuromuscular re-education, Balance training, Gait training, Patient/Family education, Self Care, Joint mobilization, Stair training, Aquatic Therapy, Dry Needling, Electrical stimulation, Cryotherapy, Moist heat, Taping, Ultrasound, Ionotophoresis 4mg /ml Dexamethasone, Manual therapy, and Re-evaluation  PLAN FOR NEXT SESSION: Progress exercises; postural and body mechanics education; manual work, DN, modalities as indicated   Sanjuana Mae, PTA 06/19/2022, 8:42 AM

## 2022-06-21 ENCOUNTER — Ambulatory Visit: Payer: BC Managed Care – PPO

## 2022-06-21 DIAGNOSIS — R29898 Other symptoms and signs involving the musculoskeletal system: Secondary | ICD-10-CM

## 2022-06-21 DIAGNOSIS — M25561 Pain in right knee: Secondary | ICD-10-CM

## 2022-06-21 DIAGNOSIS — R0789 Other chest pain: Secondary | ICD-10-CM

## 2022-06-21 DIAGNOSIS — M6281 Muscle weakness (generalized): Secondary | ICD-10-CM

## 2022-06-21 NOTE — Therapy (Signed)
OUTPATIENT PHYSICAL THERAPY LOWER EXTREMITY TREATMENT   Patient Name: Veronica Pennington MRN: [id number] DOB:10/15/1970, 52 y.o., female Today's Date: 06/21/2022  END OF SESSION:  PT End of Session - 06/21/22 0847     Visit Number 4    Number of Visits 16    Date for PT Re-Evaluation 08/05/22    Authorization Type [employer]    PT Start Time [phone]    PT Stop Time 0930    PT Time Calculation (min) 44 min    Activity Tolerance Patient tolerated treatment well    Behavior During Therapy [facility] for tasks assessed/performed             Past Medical History:  Diagnosis Date   Anxiety    Chest wall pain, chronic 11/10/2012   Costochondritis    Family history of adverse reaction to anesthesia    mother has nausea   Headache 05/05/2008   Formatting of this note might be different from the original. Headache  ICD-10 cut over   Mixed hyperlipidemia 11/11/2018   Palpitations 11/25/2012   Pectus excavatum 11/10/2012   Peptic ulcer 05/05/2008   Formatting of this note might be different from the original. Peptic Ulcer  10/1 IMO update   Recurrent boils 10/25/2019   Stress incontinence of urine 07/17/2020   Vaginal discharge 02/19/2017   Past Surgical History:  Procedure Laterality Date   EXTRACORPOREAL SHOCK WAVE LITHOTRIPSY Left 05/15/2020   Procedure: EXTRACORPOREAL SHOCK WAVE LITHOTRIPSY (ESWL);  Surgeon: Jannifer Hick, MD;  Location: [facility];  Service: Urology;  Laterality: Left;   NO PAST SURGERIES     Patient Active Problem List   Diagnosis Date Noted   Alcohol use disorder in remission 04/15/2022   Weakness 04/15/2022   Loose stools 04/15/2022   Sinus bradycardia 07/19/2020   Dizziness 07/19/2020   Stress incontinence of urine 07/17/2020   Family history of adverse reaction to anesthesia    Anxiety 10/25/2019   Costochondritis 10/25/2019   Recurrent boils 10/25/2019   Mixed hyperlipidemia 11/11/2018   Vaginal discharge 02/19/2017   Palpitations 11/25/2012    Chest wall pain, chronic 11/10/2012   Pectus excavatum 11/10/2012   Headache 05/05/2008   Peptic ulcer 05/05/2008    PCP: Christen Butter, NP  REFERRING PROVIDER: Christen Butter, NP  REFERRING DIAG: Acute Rt knee pain   THERAPY DIAG:  Acute pain of right knee  Other symptoms and signs involving the musculoskeletal system  Chest wall pain  Muscle weakness (generalized)  Rationale for Evaluation and Treatment: Rehabilitation  ONSET DATE: 04/12/22  SUBJECTIVE:   SUBJECTIVE STATEMENT: Patient reports this morning she had tightness along L lateral knee and tenderness along R medial knee.   From eval: Patient reports that she started having Rt knee pain in March with no known injury. Dogs ran into the outside of the Rt knee which created soreness. Now has aching and tightness in the Rt knee on a constant basis. Some times symptoms are worse than others. Most of the pain in on the medial side of the knee. Patient also has history of chronic pain in the chest wall for the past 20 years and pain in the shoulders, neck and feet and shins.  PERTINENT HISTORY: Chronic pain - neck, shoulders, chest wall, feet, shins ~ 20 years; anxiety PAIN:  Are you having pain? Yes:  PAIN:  Are you having pain? Yes: Pain location: 2 Pain description: aching and tight Aggravating factors: moving after being still Relieving factors: heat  PRECAUTIONS: None  WEIGHT BEARING RESTRICTIONS: No  FALLS:  Has patient fallen in last 6 months? Yes. Number of falls 2 dogs knocked her down once; tripped down the steps once   LIVING ENVIRONMENT: Lives with: lives with their family and lives with their son Lives in: House/apartment Stairs: Yes: Internal: 14 steps; can reach right and External: 5 steps; can reach both Has following equipment at home: None  OCCUPATION: desk/computer 8+ hours/day for 28 years; household chores; 53 yr old son; dogs; sedentary   PLOF: Independent  PATIENT GOALS: reduce pain and  learn tools to prevent recurrent pain; become stronger   NEXT MD VISIT: none scheduled   OBJECTIVE:   EDEMA:  No significant edema noted Rt knee   MUSCLE LENGTH: Hamstrings: Right 70 deg; Left 75 deg Thomas test: Right neutral only; Left neutral only  POSTURE: rounded shoulders, forward head, and increased lumbar lordosis  PALPATION: Muscular tightness medial Rt knee to distal hip adductors Tightness rectus; transverse abdominals; fascia transverse abdominals/lats  Additional palpation deferred   LOWER EXTREMITY ROM: WFL's end range tightness noted Rt knee flexion no pain        Tight hamstrings; piriformis; hip adductors; ITB Rt > Lt    LOWER EXTREMITY MMT:  MMT Right eval Left eval  Hip flexion 5- 5  Hip extension 4 4  Hip abduction 4 5-  Hip adduction 4+ 5-  Hip internal rotation    Hip external rotation    Knee flexion 5 5  Knee extension 5 5  Ankle dorsiflexion    Ankle plantarflexion 5-  5  Ankle inversion    Ankle eversion     (Blank rows = not tested)  LOWER EXTREMITY SPECIAL TESTS:  Knee special tests: Anterior drawer test: negative and Posterior drawer test: negative; medial/lateral joint stress negative; patellar tracking ~ symmetrical bilat tracking laterally to medially at full extension   FUNCTIONAL TESTS:  5 times sit to stand:   GAIT: Distance walked: 40 Assistive device utilized: None Level of assistance: Complete Independence Comments: WNL's no notable limp    [facility] Adult PT Treatment:                                                DATE: 06/21/2022 Therapeutic Exercise: Recumbent bike L7 x 5 min S/L TFL/ITB stretch (R) Gastroc/soleus stretches against wall --> added toe extension Myofascial self-massage: standing tennis ball R TFL --> foam roller ITB & gastroc Heel raises parallel & IR --> bilateral heel raises/eccentric lower on R Manual Therapy: IASTM/STM R medial gastroc, L distal quad/ITB   [facility] Adult PT Treatment:                                                 DATE: 06/19/2022 Therapeutic Exercise: Recumbent bike L5 x 5 min HS/ITB/ADD stretches w/strap x30" each 3-way HS stretch x30" each SAQ + ball squeeze (ankles) 3x10x3" Small range SLR in ER 12x5" Hooklying hip add ball squeezes 10x5"  Thomas stretch off end of table --> standing hip flexor stretch Bridges + ball squeeze x12 --> figure 4 bridge x10 Supine R glute stretch Seated LAQ + ball squeeze 10x3" Seated LAQ + ball b/w ankles x10 Straddle (add) stretch S/L add  leg raises --> added pulses --> added small circles  S/L front/back leg kicks --> IR leg raises in hip ext --> front/back heel taps in ER Seated figure 4 stretch   [facility] Adult PT Treatment:                                                DATE: 06/14/22 Therapeutic Exercise: Nustep L6 x 5 min LEs only Supine Hamstring stretch with strap x30 sec ITB stretch with strap x30 sec Hip adductor stretch with strap x30 sec Hip flexor stretch x30 sec SLR + ER 2x10 SAQ + ball squeeze 2x10x3 sec Bridge + ball squeeze 2x10x3 sec Deep knee flexion hamstring setting 10x5 sec Prone  Quad stretch with strap x30 sec S/L Hip adduction 2x10 Hip abduction 2x10 Quadruped Knee mobilization with towel in bend coming into child's pose x10 Manual Therapy: IASTM adductors, VMO, medial hamstring Grade II to III patellar and knee mobilization for flexion   PATIENT EDUCATION:  Education details: Updated HEP Person educated: Patient Education method: Explanation, Demonstration, Tactile cues, Verbal cues, and Handouts Education comprehension: verbalized understanding, returned demonstration, verbal cues required, tactile cues required, and needs further education  HOME EXERCISE PROGRAM: Access Code: [email or web address] URL: [email or web address] Date: 06/21/2022 Prepared by: Carlynn Herald  Exercises - Hooklying Hamstring Stretch with Strap  - 2 x daily - 7 x weekly - 1 sets - 3 reps - 30 sec  hold - Supine  ITB Stretch with Strap  - 2 x daily - 7 x weekly - 1 sets - 3 reps - 30 sec  hold - Hip Adductors and Hamstring Stretch with Strap  - 2 x daily - 7 x weekly - 1 sets - 3 reps - 30 sec  hold - Supine Piriformis Stretch with Leg Straight  - 2 x daily - 7 x weekly - 1 sets - 3 reps - 30 sec  hold - Hip Flexor Stretch at Edge of Bed  - 2 x daily - 7 x weekly - 1 sets - 3 reps - 30 sec  hold - Supine Diaphragmatic Breathing  - 2 x daily - 7 x weekly - 1 sets - 10 reps - 4-6 sec  hold - Seated Hip Flexor Stretch  - 2 x daily - 7 x weekly - 1 sets - 3 reps - 30 sec  hold - Short Arc Quad with [employer]  - 1 x daily - 7 x weekly - 2 sets - 10 reps - Long Sitting Hamstring Set  - 1 x daily - 7 x weekly - 2 sets - 10 reps - Sidelying Hip Adduction  - 1 x daily - 7 x weekly - 2 sets - 10 reps - Supine Bridge with Mini Swiss Ball Between Knees  - 1 x daily - 7 x weekly - 3 sets - 10 reps - Figure 4 Bridge  - 1 x daily - 7 x weekly - 3 sets - 10 reps - Seated Long Arc Quad with Hip Adduction  - 1 x daily - 7 x weekly - 3 sets - 10 reps - Standing Calf Raise With Small Ball at Heels  - 1 x daily - 7 x weekly - 3 sets - 10 reps - Wall Squat Hold with Ball  - 1 x daily - 7 x weekly - 3 sets - 10 reps -  Standing Gastroc Stretch on Foam 1/2 Roll  - 1 x daily - 7 x weekly - 3 sets - 10 reps - Standing Soleus Stretch on Foam 1/2 Roll  - 1 x daily - 7 x weekly - 3 sets - 10 reps  ASSESSMENT:  CLINICAL IMPRESSION: Session focused on gastroc/soleus stretching and strengthening. Myofascial tension addressed with self-massage techniques using tennis ball and foam roller.  From eval: Patient is a 52 y.o. female who was seen today for physical therapy evaluation and treatment for acute Rt knee pain, history of chronic pain including chest wall pain and Rt shoulder/neck pain leading to headaches. Patient has poor posture and alignment; postural habits contributing to knee pain; limited Rt > Lt mobility/tissue  extensibility; muscular tightness to palpation; weakness; sedentary lifestyle. Patient will benefit from PT to address problems identified.   OBJECTIVE IMPAIRMENTS: decreased activity tolerance, decreased balance, decreased mobility, difficulty walking, decreased ROM, decreased strength, increased edema, increased fascial restrictions, impaired flexibility, improper body mechanics, postural dysfunction, and pain.    GOALS: Goals reviewed with patient? Yes  SHORT TERM GOALS: Target date: 07/08/2022  Independent in initial HEP Baseline: Goal status: INITIAL  2.  Patient reports beginning aerobic exercises at least 3 times for week for 15-20 min  Baseline:  Goal status: INITIAL   LONG TERM GOALS: Target date: 08/05/2022  Increase tissue extensibility and mobility in bilat LE's  Baseline:  Goal status: INITIAL  2.  5/5 strength bilat LE's  Baseline:  Goal status: INITIAL  3.  Improve posture and alignment with patient demonstrating improved upright posture with posterior shoulder girdle engaged  Baseline:  Goal status: INITIAL  4.  Decrease pain in Rt knee by 50-75% allowing patient to stand and work with less pain  Baseline:  Goal status: INITIAL  5.  Decrease sensation of tightness through chest wall by 50-75% allowing patient to perform deep breath without limitation  Baseline:  Goal status: INITIAL  6.  Independent tin HEP including aquatic program as indicated  Baseline:  Goal status: INITIAL   PLAN:  PT FREQUENCY: 2x/week  PT DURATION: 8 weeks  PLANNED INTERVENTIONS: Therapeutic exercises, Therapeutic activity, Neuromuscular re-education, Balance training, Gait training, Patient/Family education, Self Care, Joint mobilization, Stair training, Aquatic Therapy, Dry Needling, Electrical stimulation, Cryotherapy, Moist heat, Taping, Ultrasound, Ionotophoresis 4mg /ml Dexamethasone, Manual therapy, and Re-evaluation  PLAN FOR NEXT SESSION: Soleus strengthening,TFL  release. Progress exercises; postural and body mechanics education; manual work, DN, modalities as indicated   Sanjuana Mae, PTA 06/21/2022, 9:32 AM

## 2022-06-24 ENCOUNTER — Encounter: Payer: Self-pay | Admitting: Rehabilitative and Restorative Service Providers"

## 2022-06-24 ENCOUNTER — Ambulatory Visit: Payer: BC Managed Care – PPO | Admitting: Rehabilitative and Restorative Service Providers"

## 2022-06-24 DIAGNOSIS — M6281 Muscle weakness (generalized): Secondary | ICD-10-CM

## 2022-06-24 DIAGNOSIS — R0789 Other chest pain: Secondary | ICD-10-CM

## 2022-06-24 DIAGNOSIS — M25561 Pain in right knee: Secondary | ICD-10-CM | POA: Diagnosis not present

## 2022-06-24 DIAGNOSIS — R29898 Other symptoms and signs involving the musculoskeletal system: Secondary | ICD-10-CM

## 2022-06-24 NOTE — Therapy (Signed)
OUTPATIENT PHYSICAL THERAPY LOWER EXTREMITY TREATMENT   Patient Name: Veronica Pennington MRN: [id number] DOB:[birth year], 52 y.o., female Today's Date: 06/24/2022  END OF SESSION:  PT End of Session - 06/24/22 0812     Visit Number 5    Number of Visits 16    Date for PT Re-Evaluation 08/05/22    Authorization Type [employer]    PT Start Time 0808    PT Stop Time 0848    PT Time Calculation (min) 40 min    Activity Tolerance Patient tolerated treatment well             Past Medical History:  Diagnosis Date   Anxiety    Chest wall pain, chronic 11/10/2012   Costochondritis    Family history of adverse reaction to anesthesia    mother has nausea   Headache 05/05/2008   Formatting of this note might be different from the original. Headache  ICD-10 cut over   Mixed hyperlipidemia 11/11/2018   Palpitations 11/25/2012   Pectus excavatum 11/10/2012   Peptic ulcer 05/05/2008   Formatting of this note might be different from the original. Peptic Ulcer  10/1 IMO update   Recurrent boils 10/25/2019   Stress incontinence of urine 07/17/2020   Vaginal discharge 02/19/2017   Past Surgical History:  Procedure Laterality Date   EXTRACORPOREAL SHOCK WAVE LITHOTRIPSY Left 05/15/2020   Procedure: EXTRACORPOREAL SHOCK WAVE LITHOTRIPSY (ESWL);  Surgeon: Jannifer Hick, MD;  Location: [facility];  Service: Urology;  Laterality: Left;   NO PAST SURGERIES     Patient Active Problem List   Diagnosis Date Noted   Alcohol use disorder in remission 04/15/2022   Weakness 04/15/2022   Loose stools 04/15/2022   Sinus bradycardia 07/19/2020   Dizziness 07/19/2020   Stress incontinence of urine 07/17/2020   Family history of adverse reaction to anesthesia    Anxiety 10/25/2019   Costochondritis 10/25/2019   Recurrent boils 10/25/2019   Mixed hyperlipidemia 11/11/2018   Vaginal discharge 02/19/2017   Palpitations 11/25/2012   Chest wall pain, chronic 11/10/2012   Pectus excavatum  11/10/2012   Headache 05/05/2008   Peptic ulcer 05/05/2008    PCP: Christen Butter, NP  REFERRING PROVIDER: Christen Butter, NP  REFERRING DIAG: Acute Rt knee pain   THERAPY DIAG:  Acute pain of right knee  Other symptoms and signs involving the musculoskeletal system  Chest wall pain  Muscle weakness (generalized)  Rationale for Evaluation and Treatment: Rehabilitation  ONSET DATE: 04/12/22  SUBJECTIVE:   SUBJECTIVE STATEMENT: Patient reports tightness along Rt medial knee and she could feel some shooting pain into the lateral Rt thigh area. Responded well to the IASTM. Working on exercises most nights. She feels the knee has improved in the last couple of weeks.   -she has pain in the "back ribs but can also have pain that flares up in the chest and "front ribs" as well as down into lower ribs - present for 25 years. She has increased symptoms wen she has tension headaches.  It feels like her whole body hurts with things are flare up.   From eval: Patient reports that she started having Rt knee pain in March with no known injury. Dogs ran into the outside of the Rt knee which created soreness. Now has aching and tightness in the Rt knee on a constant basis. Some times symptoms are worse than others. Most of the pain in on the medial side of the knee. Patient also has history  of chronic pain in the chest wall for the past 20 years and pain in the shoulders, neck and feet and shins.  PERTINENT HISTORY: Chronic pain - neck, shoulders, chest wall, feet, shins ~ 20 years; anxiety PAIN:  Are you having pain? Yes:  PAIN:  Are you having pain? Yes: Pain location: 2/10  Rt knee  Midback/chest area 2/10  Pain description: aching and tight Aggravating factors: moving after being still Relieving factors: heat   PRECAUTIONS: None  WEIGHT BEARING RESTRICTIONS: No  FALLS:  Has patient fallen in last 6 months? Yes. Number of falls 2 dogs knocked her down once; tripped down the steps once    LIVING ENVIRONMENT: Lives with: lives with their family and lives with their son Lives in: House/apartment Stairs: Yes: Internal: 14 steps; can reach right and External: 5 steps; can reach both Has following equipment at home: None  OCCUPATION: desk/computer 8+ hours/day for 28 years; household chores; 67 yr old son; dogs; sedentary   PLOF: Independent  PATIENT GOALS: reduce pain and learn tools to prevent recurrent pain; become stronger   NEXT MD VISIT: none scheduled   OBJECTIVE:   EDEMA:  No significant edema noted Rt knee   MUSCLE LENGTH: Hamstrings: Right 70 deg; Left 75 deg Thomas test: Right neutral only; Left neutral only  POSTURE: rounded shoulders, forward head, and increased lumbar lordosis  PALPATION: Muscular tightness medial Rt knee to distal hip adductors Tightness rectus; transverse abdominals; fascia transverse abdominals/lats  Additional palpation deferred   06/24/22: tightness noted through the pecs; anterior chest/ribs; upper traps; abs as noted above    LOWER EXTREMITY ROM: WFL's end range tightness noted Rt knee flexion no pain        Tight hamstrings; piriformis; hip adductors; ITB Rt > Lt   06/24/22: Trunk ROM:  Trunk flexion 70% pulling posterior thorax Extension 50% pulling abs   LOWER EXTREMITY MMT:  MMT Right eval Left eval  Hip flexion 5- 5  Hip extension 4 4  Hip abduction 4 5-  Hip adduction 4+ 5-  Hip internal rotation    Hip external rotation    Knee flexion 5 5  Knee extension 5 5  Ankle dorsiflexion    Ankle plantarflexion 5-  5  Ankle inversion    Ankle eversion     (Blank rows = not tested)  LOWER EXTREMITY SPECIAL TESTS:  Knee special tests: Anterior drawer test: negative and Posterior drawer test: negative; medial/lateral joint stress negative; patellar tracking ~ symmetrical bilat tracking laterally to medially at full extension   FUNCTIONAL TESTS:  5 times sit to stand:   GAIT: Distance walked: 40 Assistive  device utilized: None Level of assistance: Complete Independence Comments: WNL's no notable limp    [facility] Adult PT Treatment:                                                DATE: 06/21/2022 Therapeutic Exercise: Recumbent bike L7 x 5 min S/L TFL/ITB stretch (R) Gastroc/soleus stretches against wall --> added toe extension Myofascial self-massage: standing tennis ball R TFL --> foam roller ITB & gastroc Heel raises parallel & IR --> bilateral heel raises/eccentric lower on R Manual Therapy: IASTM/STM R medial gastroc, L distal quad/ITB   [facility] Adult PT Treatment:  DATE: 06/19/2022 Therapeutic Exercise: Recumbent bike L5 x 5 min HS/ITB/ADD stretches w/strap x30" each 3-way HS stretch x30" each SAQ + ball squeeze (ankles) 3x10x3" Small range SLR in ER 12x5" Hooklying hip add ball squeezes 10x5"  Thomas stretch off end of table --> standing hip flexor stretch Bridges + ball squeeze x12 --> figure 4 bridge x10 Supine R glute stretch Seated LAQ + ball squeeze 10x3" Seated LAQ + ball b/w ankles x10 Straddle (add) stretch S/L add leg raises --> added pulses --> added small circles  S/L front/back leg kicks --> IR leg raises in hip ext --> front/back heel taps in ER Seated figure 4 stretch   [facility] Adult PT Treatment:                                                DATE: 06/14/22 Therapeutic Exercise: Nustep L6 x 5 min LEs only Supine Hamstring stretch with strap x30 sec ITB stretch with strap x30 sec Hip adductor stretch with strap x30 sec Hip flexor stretch x30 sec SLR + ER 2x10 SAQ + ball squeeze 2x10x3 sec Bridge + ball squeeze 2x10x3 sec Deep knee flexion hamstring setting 10x5 sec Prone  Quad stretch with strap x30 sec S/L Hip adduction 2x10 Hip abduction 2x10 Quadruped Knee mobilization with towel in bend coming into child's pose x10 Manual Therapy: IASTM adductors, VMO, medial hamstring Grade II to III patellar and knee  mobilization for flexion   PATIENT EDUCATION:  Access Code: [email or web address] URL: [email or web address] Date: 06/24/2022 Prepared by: Corlis Leak  Exercises - Hooklying Hamstring Stretch with Strap  - 2 x daily - 7 x weekly - 1 sets - 3 reps - 30 sec  hold - Supine ITB Stretch with Strap  - 2 x daily - 7 x weekly - 1 sets - 3 reps - 30 sec  hold - Hip Adductors and Hamstring Stretch with Strap  - 2 x daily - 7 x weekly - 1 sets - 3 reps - 30 sec  hold - Supine Piriformis Stretch with Leg Straight  - 2 x daily - 7 x weekly - 1 sets - 3 reps - 30 sec  hold - Hip Flexor Stretch at Edge of Bed  - 2 x daily - 7 x weekly - 1 sets - 3 reps - 30 sec  hold - Supine Diaphragmatic Breathing  - 2 x daily - 7 x weekly - 1 sets - 10 reps - 4-6 sec  hold - Seated Hip Flexor Stretch  - 2 x daily - 7 x weekly - 1 sets - 3 reps - 30 sec  hold - Short Arc Quad with [employer]  - 1 x daily - 7 x weekly - 2 sets - 10 reps - Long Sitting Hamstring Set  - 1 x daily - 7 x weekly - 2 sets - 10 reps - Sidelying Hip Adduction  - 1 x daily - 7 x weekly - 2 sets - 10 reps - Supine Bridge with Mini Swiss Ball Between Knees  - 1 x daily - 7 x weekly - 3 sets - 10 reps - Figure 4 Bridge  - 1 x daily - 7 x weekly - 3 sets - 10 reps - Seated Long Arc Quad with Hip Adduction  - 1 x daily - 7 x weekly - 3 sets - 10 reps -  Standing Calf Raise With Small Ball at Heels  - 1 x daily - 7 x weekly - 3 sets - 10 reps - Wall Squat Hold with Ball  - 1 x daily - 7 x weekly - 3 sets - 10 reps - Standing Gastroc Stretch on Foam 1/2 Roll  - 1 x daily - 7 x weekly - 3 sets - 10 reps - Standing Soleus Stretch on Foam 1/2 Roll  - 1 x daily - 7 x weekly - 3 sets - 10 reps - Doorway Pec Stretch at 60 Degrees Abduction  - 3 x daily - 7 x weekly - 1 sets - 3 reps - Doorway Pec Stretch at 90 Degrees Abduction  - 3 x daily - 7 x weekly - 1 sets - 3 reps - 30 seconds  hold - Doorway Pec Stretch at 120 Degrees Abduction  - 3 x daily - 7 x  weekly - 1 sets - 3 reps - 30 second hold  hold - Seated Thoracic Lumbar Extension  - 2 x daily - 7 x weekly - 1 sets - 3-5 reps - 5-10 sec  hold - Supine Chest Stretch on Foam Roll  - 2 x daily - 7 x weekly - 1 sets - 1 reps - 2-5 min  sec  hold - Prone Transversus Abdominus Contraction  - 2 x daily - 7 x weekly - 2-5 min  hold - Seated Cervical Retraction  - 2 x daily - 7 x weekly - 1-2 sets - 5-10 reps - 10 sec  hold - Seated Scapular Retraction  - 2 x daily - 7 x weekly - 1-2 sets - 10 reps - 10 sec  hold - Shoulder External Rotation and Scapular Retraction  - 3 x daily - 7 x weekly - 1 sets - 10 reps - 3-5 sec   hold  ASSESSMENT:  CLINICAL IMPRESSION: Continued evaluation of thoracic dysfunction. Patient has muscular and fascia tightness through the cervical and thoracic spine into the pecs and anterior chest with compression through the abdominal musculature. Initiated postural correction and posterior shoulder girdle activation. Added myofacial release techniques. Will benefit from continued focus on releasing tightness through the thoracic spine/ribs/abs. Will try DN - first for Rt pecs/anterior chest.    From eval: Patient is a 52 y.o. female who was seen today for physical therapy evaluation and treatment for acute Rt knee pain, history of chronic pain including chest wall pain and Rt shoulder/neck pain leading to headaches. Patient has poor posture and alignment; postural habits contributing to knee pain; limited Rt > Lt mobility/tissue extensibility; muscular tightness to palpation; weakness; sedentary lifestyle. Patient will benefit from PT to address problems identified.   OBJECTIVE IMPAIRMENTS: decreased activity tolerance, decreased balance, decreased mobility, difficulty walking, decreased ROM, decreased strength, increased edema, increased fascial restrictions, impaired flexibility, improper body mechanics, postural dysfunction, and pain.    GOALS: Goals reviewed with patient?  Yes  SHORT TERM GOALS: Target date: 07/08/2022  Independent in initial HEP Baseline: Goal status: INITIAL  2.  Patient reports beginning aerobic exercises at least 3 times for week for 15-20 min  Baseline:  Goal status: INITIAL   LONG TERM GOALS: Target date: 08/05/2022  Increase tissue extensibility and mobility in bilat LE's  Baseline:  Goal status: INITIAL  2.  5/5 strength bilat LE's  Baseline:  Goal status: INITIAL  3.  Improve posture and alignment with patient demonstrating improved upright posture with posterior shoulder girdle engaged  Baseline:  Goal  status: INITIAL  4.  Decrease pain in Rt knee by 50-75% allowing patient to stand and work with less pain  Baseline:  Goal status: INITIAL  5.  Decrease sensation of tightness through chest wall by 50-75% allowing patient to perform deep breath without limitation  Baseline:  Goal status: INITIAL  6.  Independent tin HEP including aquatic program as indicated  Baseline:  Goal status: INITIAL   PLAN:  PT FREQUENCY: 2x/week  PT DURATION: 8 weeks  PLANNED INTERVENTIONS: Therapeutic exercises, Therapeutic activity, Neuromuscular re-education, Balance training, Gait training, Patient/Family education, Self Care, Joint mobilization, Stair training, Aquatic Therapy, Dry Needling, Electrical stimulation, Cryotherapy, Moist heat, Taping, Ultrasound, Ionotophoresis 4mg /ml Dexamethasone, Manual therapy, and Re-evaluation  PLAN FOR NEXT SESSION: Soleus strengthening,TFL release. Progress exercises; postural and body mechanics education; manual work, DN, modalities as indicated   [employer], PT 06/24/2022, 8:12 AM

## 2022-06-26 ENCOUNTER — Ambulatory Visit: Payer: BC Managed Care – PPO

## 2022-06-26 DIAGNOSIS — R29898 Other symptoms and signs involving the musculoskeletal system: Secondary | ICD-10-CM

## 2022-06-26 DIAGNOSIS — R0789 Other chest pain: Secondary | ICD-10-CM

## 2022-06-26 DIAGNOSIS — M25561 Pain in right knee: Secondary | ICD-10-CM

## 2022-06-26 DIAGNOSIS — M6281 Muscle weakness (generalized): Secondary | ICD-10-CM

## 2022-06-26 NOTE — Therapy (Signed)
OUTPATIENT PHYSICAL THERAPY LOWER EXTREMITY TREATMENT   Patient Name: Veronica Pennington MRN: [id number] DOB:04-Jan-1971, 52 y.o., female Today's Date: 06/26/2022  END OF SESSION:  PT End of Session - 06/26/22 0800     Visit Number 6    Number of Visits 16    Date for PT Re-Evaluation 08/05/22    Authorization Type [employer]    PT Start Time 0800    PT Stop Time 0844    PT Time Calculation (min) 44 min    Activity Tolerance Patient tolerated treatment well    Behavior During Therapy [facility] for tasks assessed/performed             Past Medical History:  Diagnosis Date   Anxiety    Chest wall pain, chronic 11/10/2012   Costochondritis    Family history of adverse reaction to anesthesia    mother has nausea   Headache 05/05/2008   Formatting of this note might be different from the original. Headache  ICD-10 cut over   Mixed hyperlipidemia 11/11/2018   Palpitations 11/25/2012   Pectus excavatum 11/10/2012   Peptic ulcer 05/05/2008   Formatting of this note might be different from the original. Peptic Ulcer  10/1 IMO update   Recurrent boils 10/25/2019   Stress incontinence of urine 07/17/2020   Vaginal discharge 02/19/2017   Past Surgical History:  Procedure Laterality Date   EXTRACORPOREAL SHOCK WAVE LITHOTRIPSY Left 05/15/2020   Procedure: EXTRACORPOREAL SHOCK WAVE LITHOTRIPSY (ESWL);  Surgeon: Jannifer Hick, MD;  Location: [facility];  Service: Urology;  Laterality: Left;   NO PAST SURGERIES     Patient Active Problem List   Diagnosis Date Noted   Alcohol use disorder in remission 04/15/2022   Weakness 04/15/2022   Loose stools 04/15/2022   Sinus bradycardia 07/19/2020   Dizziness 07/19/2020   Stress incontinence of urine 07/17/2020   Family history of adverse reaction to anesthesia    Anxiety 10/25/2019   Costochondritis 10/25/2019   Recurrent boils 10/25/2019   Mixed hyperlipidemia 11/11/2018   Vaginal discharge 02/19/2017   Palpitations 11/25/2012    Chest wall pain, chronic 11/10/2012   Pectus excavatum 11/10/2012   Headache 05/05/2008   Peptic ulcer 05/05/2008    PCP: Christen Butter, NP  REFERRING PROVIDER: Christen Butter, NP  REFERRING DIAG: Acute Rt knee pain   THERAPY DIAG:  Acute pain of right knee  Other symptoms and signs involving the musculoskeletal system  Chest wall pain  Muscle weakness (generalized)  Rationale for Evaluation and Treatment: Rehabilitation  ONSET DATE: 04/12/22  SUBJECTIVE:   SUBJECTIVE STATEMENT: Patient reports the medial knee is still tender and sore but feels like strengthening exercises are helping. Patient states she has a dull pain and a "catch" at R shoulder blade and tenderness along lower armpit area.   From eval: Patient reports that she started having Rt knee pain in March with no known injury. Dogs ran into the outside of the Rt knee which created soreness. Now has aching and tightness in the Rt knee on a constant basis. Some times symptoms are worse than others. Most of the pain in on the medial side of the knee. Patient also has history of chronic pain in the chest wall for the past 20 years and pain in the shoulders, neck and feet and shins.  PERTINENT HISTORY: Chronic pain - neck, shoulders, chest wall, feet, shins ~ 20 years; anxiety PAIN:  Are you having pain? Yes:  PAIN:  Are you having pain?  Yes: Pain location: 2/10  Rt knee  Midback/chest area 2/10  Pain description: aching and tight Aggravating factors: moving after being still Relieving factors: heat   PRECAUTIONS: None  WEIGHT BEARING RESTRICTIONS: No  FALLS:  Has patient fallen in last 6 months? Yes. Number of falls 2 dogs knocked her down once; tripped down the steps once   LIVING ENVIRONMENT: Lives with: lives with their family and lives with their son Lives in: House/apartment Stairs: Yes: Internal: 14 steps; can reach right and External: 5 steps; can reach both Has following equipment at home:  None  OCCUPATION: desk/computer 8+ hours/day for 28 years; household chores; 21 yr old son; dogs; sedentary   PLOF: Independent  PATIENT GOALS: reduce pain and learn tools to prevent recurrent pain; become stronger   NEXT MD VISIT: none scheduled   OBJECTIVE:   EDEMA:  No significant edema noted Rt knee   MUSCLE LENGTH: Hamstrings: Right 70 deg; Left 75 deg Thomas test: Right neutral only; Left neutral only  POSTURE: rounded shoulders, forward head, and increased lumbar lordosis  PALPATION: Muscular tightness medial Rt knee to distal hip adductors Tightness rectus; transverse abdominals; fascia transverse abdominals/lats  Additional palpation deferred   06/24/22: tightness noted through the pecs; anterior chest/ribs; upper traps; abs as noted above    LOWER EXTREMITY ROM: WFL's end range tightness noted Rt knee flexion no pain        Tight hamstrings; piriformis; hip adductors; ITB Rt > Lt   06/24/22: Trunk ROM:  Trunk flexion 70% pulling posterior thorax Extension 50% pulling abs   LOWER EXTREMITY MMT:  MMT Right eval Left eval  Hip flexion 5- 5  Hip extension 4 4  Hip abduction 4 5-  Hip adduction 4+ 5-  Hip internal rotation    Hip external rotation    Knee flexion 5 5  Knee extension 5 5  Ankle dorsiflexion    Ankle plantarflexion 5-  5  Ankle inversion    Ankle eversion     (Blank rows = not tested)  LOWER EXTREMITY SPECIAL TESTS:  Knee special tests: Anterior drawer test: negative and Posterior drawer test: negative; medial/lateral joint stress negative; patellar tracking ~ symmetrical bilat tracking laterally to medially at full extension   FUNCTIONAL TESTS:  5 times sit to stand:   GAIT: Distance walked: 40 Assistive device utilized: None Level of assistance: Complete Independence Comments: WNL's no notable limp    [facility] Adult PT Treatment:                                                DATE: 06/26/2022 Therapeutic Exercise: Recumbent bike  warm-up L7 x Corner pec stretch x30" --> Doorway pec stretch 3 positions 3x30" Supine on foam roller: angel arms, bent elbow arm circles, hug/t-stretch, core marching Self-TPR with theracane R scapula --> myofascial release pecs Seated thoracic extension with deflated coregeous ball Shoulder ER isometric step out GTB (R) x10 Seated shoulder in abduction (elbow propped on FR) YTB x10 Manual Therapy: STM posterior shoulder girdle    [facility] Adult PT Treatment:                                                DATE: 06/21/2022 Therapeutic  Exercise: Recumbent bike L7 x 5 min S/L TFL/ITB stretch (R) Gastroc/soleus stretches against wall --> added toe extension Myofascial self-massage: standing tennis ball R TFL --> foam roller ITB & gastroc Heel raises parallel & IR --> bilateral heel raises/eccentric lower on R Manual Therapy: IASTM/STM R medial gastroc, L distal quad/ITB   PATIENT EDUCATION:  Access Code: [email or web address] URL: [email or web address] Date: 06/24/2022 Prepared by: Corlis Leak  Exercises - Hooklying Hamstring Stretch with Strap  - 2 x daily - 7 x weekly - 1 sets - 3 reps - 30 sec  hold - Supine ITB Stretch with Strap  - 2 x daily - 7 x weekly - 1 sets - 3 reps - 30 sec  hold - Hip Adductors and Hamstring Stretch with Strap  - 2 x daily - 7 x weekly - 1 sets - 3 reps - 30 sec  hold - Supine Piriformis Stretch with Leg Straight  - 2 x daily - 7 x weekly - 1 sets - 3 reps - 30 sec  hold - Hip Flexor Stretch at Edge of Bed  - 2 x daily - 7 x weekly - 1 sets - 3 reps - 30 sec  hold - Supine Diaphragmatic Breathing  - 2 x daily - 7 x weekly - 1 sets - 10 reps - 4-6 sec  hold - Seated Hip Flexor Stretch  - 2 x daily - 7 x weekly - 1 sets - 3 reps - 30 sec  hold - Short Arc Quad with [employer]  - 1 x daily - 7 x weekly - 2 sets - 10 reps - Long Sitting Hamstring Set  - 1 x daily - 7 x weekly - 2 sets - 10 reps - Sidelying Hip Adduction  - 1 x daily - 7 x weekly - 2 sets  - 10 reps - Supine Bridge with Mini Swiss Ball Between Knees  - 1 x daily - 7 x weekly - 3 sets - 10 reps - Figure 4 Bridge  - 1 x daily - 7 x weekly - 3 sets - 10 reps - Seated Long Arc Quad with Hip Adduction  - 1 x daily - 7 x weekly - 3 sets - 10 reps - Standing Calf Raise With Small Ball at Heels  - 1 x daily - 7 x weekly - 3 sets - 10 reps - Wall Squat Hold with Ball  - 1 x daily - 7 x weekly - 3 sets - 10 reps - Standing Gastroc Stretch on Foam 1/2 Roll  - 1 x daily - 7 x weekly - 3 sets - 10 reps - Standing Soleus Stretch on Foam 1/2 Roll  - 1 x daily - 7 x weekly - 3 sets - 10 reps - Doorway Pec Stretch at 60 Degrees Abduction  - 3 x daily - 7 x weekly - 1 sets - 3 reps - Doorway Pec Stretch at 90 Degrees Abduction  - 3 x daily - 7 x weekly - 1 sets - 3 reps - 30 seconds  hold - Doorway Pec Stretch at 120 Degrees Abduction  - 3 x daily - 7 x weekly - 1 sets - 3 reps - 30 second hold  hold - Seated Thoracic Lumbar Extension  - 2 x daily - 7 x weekly - 1 sets - 3-5 reps - 5-10 sec  hold - Supine Chest Stretch on Foam Roll  - 2 x daily - 7 x weekly - 1 sets - 1  reps - 2-5 min  sec  hold - Prone Transversus Abdominus Contraction  - 2 x daily - 7 x weekly - 2-5 min  hold - Seated Cervical Retraction  - 2 x daily - 7 x weekly - 1-2 sets - 5-10 reps - 10 sec  hold - Seated Scapular Retraction  - 2 x daily - 7 x weekly - 1-2 sets - 10 reps - 10 sec  hold - Shoulder External Rotation and Scapular Retraction  - 3 x daily - 7 x weekly - 1 sets - 10 reps - 3-5 sec   hold  ASSESSMENT:  CLINICAL IMPRESSION: Session focused on pec stretching and posterior shoulder strengthening. Mild fatigue noted with resisted shoulder ER in abduction. R anterior shoulder discomfort reported with hands behind position during seated thoracic extension.   From eval: Patient is a 52 y.o. female who was seen today for physical therapy evaluation and treatment for acute Rt knee pain, history of chronic pain including  chest wall pain and Rt shoulder/neck pain leading to headaches. Patient has poor posture and alignment; postural habits contributing to knee pain; limited Rt > Lt mobility/tissue extensibility; muscular tightness to palpation; weakness; sedentary lifestyle. Patient will benefit from PT to address problems identified.   OBJECTIVE IMPAIRMENTS: decreased activity tolerance, decreased balance, decreased mobility, difficulty walking, decreased ROM, decreased strength, increased edema, increased fascial restrictions, impaired flexibility, improper body mechanics, postural dysfunction, and pain.    GOALS: Goals reviewed with patient? Yes  SHORT TERM GOALS: Target date: 07/08/2022  Independent in initial HEP Baseline: Goal status: INITIAL  2.  Patient reports beginning aerobic exercises at least 3 times for week for 15-20 min  Baseline:  Goal status: INITIAL   LONG TERM GOALS: Target date: 08/05/2022  Increase tissue extensibility and mobility in bilat LE's  Baseline:  Goal status: INITIAL  2.  5/5 strength bilat LE's  Baseline:  Goal status: INITIAL  3.  Improve posture and alignment with patient demonstrating improved upright posture with posterior shoulder girdle engaged  Baseline:  Goal status: INITIAL  4.  Decrease pain in Rt knee by 50-75% allowing patient to stand and work with less pain  Baseline:  Goal status: INITIAL  5.  Decrease sensation of tightness through chest wall by 50-75% allowing patient to perform deep breath without limitation  Baseline:  Goal status: INITIAL  6.  Independent tin HEP including aquatic program as indicated  Baseline:  Goal status: INITIAL   PLAN:  PT FREQUENCY: 2x/week  PT DURATION: 8 weeks  PLANNED INTERVENTIONS: Therapeutic exercises, Therapeutic activity, Neuromuscular re-education, Balance training, Gait training, Patient/Family education, Self Care, Joint mobilization, Stair training, Aquatic Therapy, Dry Needling, Electrical  stimulation, Cryotherapy, Moist heat, Taping, Ultrasound, Ionotophoresis 4mg /ml Dexamethasone, Manual therapy, and Re-evaluation  PLAN FOR NEXT SESSION: Chest stretching, posterior shoulder strengthening. Progress exercises; postural and body mechanics education; manual work, DN, modalities as indicated   Carlynn Herald, PTA 06/26/2022 8:47 AM

## 2022-07-01 ENCOUNTER — Ambulatory Visit: Payer: BC Managed Care – PPO | Admitting: Rehabilitative and Restorative Service Providers"

## 2022-07-01 ENCOUNTER — Encounter: Payer: Self-pay | Admitting: Rehabilitative and Restorative Service Providers"

## 2022-07-01 DIAGNOSIS — M25561 Pain in right knee: Secondary | ICD-10-CM

## 2022-07-01 DIAGNOSIS — R0789 Other chest pain: Secondary | ICD-10-CM

## 2022-07-01 DIAGNOSIS — R29898 Other symptoms and signs involving the musculoskeletal system: Secondary | ICD-10-CM

## 2022-07-01 DIAGNOSIS — M6281 Muscle weakness (generalized): Secondary | ICD-10-CM

## 2022-07-01 NOTE — Therapy (Signed)
OUTPATIENT PHYSICAL THERAPY LOWER EXTREMITY TREATMENT   Patient Name: Veronica Pennington MRN: [id number] DOB:01-10-1971, 52 y.o., female Today's Date: 07/01/2022  END OF SESSION:  PT End of Session - 07/01/22 0816     Visit Number 7    Number of Visits 16    Date for PT Re-Evaluation 08/05/22    Authorization Type [employer]    PT Start Time 0815             Past Medical History:  Diagnosis Date   Anxiety    Chest wall pain, chronic 11/10/2012   Costochondritis    Family history of adverse reaction to anesthesia    mother has nausea   Headache 05/05/2008   Formatting of this note might be different from the original. Headache  ICD-10 cut over   Mixed hyperlipidemia 11/11/2018   Palpitations 11/25/2012   Pectus excavatum 11/10/2012   Peptic ulcer 05/05/2008   Formatting of this note might be different from the original. Peptic Ulcer  10/1 IMO update   Recurrent boils 10/25/2019   Stress incontinence of urine 07/17/2020   Vaginal discharge 02/19/2017   Past Surgical History:  Procedure Laterality Date   EXTRACORPOREAL SHOCK WAVE LITHOTRIPSY Left 05/15/2020   Procedure: EXTRACORPOREAL SHOCK WAVE LITHOTRIPSY (ESWL);  Surgeon: Jannifer Hick, MD;  Location: [facility];  Service: Urology;  Laterality: Left;   NO PAST SURGERIES     Patient Active Problem List   Diagnosis Date Noted   Alcohol use disorder in remission 04/15/2022   Weakness 04/15/2022   Loose stools 04/15/2022   Sinus bradycardia 07/19/2020   Dizziness 07/19/2020   Stress incontinence of urine 07/17/2020   Family history of adverse reaction to anesthesia    Anxiety 10/25/2019   Costochondritis 10/25/2019   Recurrent boils 10/25/2019   Mixed hyperlipidemia 11/11/2018   Vaginal discharge 02/19/2017   Palpitations 11/25/2012   Chest wall pain, chronic 11/10/2012   Pectus excavatum 11/10/2012   Headache 05/05/2008   Peptic ulcer 05/05/2008    PCP: Christen Butter, NP  REFERRING PROVIDER: Christen Butter, NP  REFERRING DIAG: Acute Rt knee pain   THERAPY DIAG:  Acute pain of right knee  Other symptoms and signs involving the musculoskeletal system  Chest wall pain  Muscle weakness (generalized)  Rationale for Evaluation and Treatment: Rehabilitation  ONSET DATE: 04/12/22  SUBJECTIVE:   SUBJECTIVE STATEMENT: Patient states she has continued dull pain and a "catch" at R shoulder blade area and tenderness just below the armpit area.   From eval: Patient reports that she started having Rt knee pain in March with no known injury. Dogs ran into the outside of the Rt knee which created soreness. Now has aching and tightness in the Rt knee on a constant basis. Some times symptoms are worse than others. Most of the pain in on the medial side of the knee. Patient also has history of chronic pain in the chest wall for the past 20 years and pain in the shoulders, neck and feet and shins.  PERTINENT HISTORY: Chronic pain - neck, shoulders, chest wall, feet, shins ~ 20 years; anxiety PAIN:  Are you having pain? Yes:  PAIN:  Are you having pain? Yes: Pain location: 2/10  Rt knee  Midback 4-5/10  Pain description: aching and tight Aggravating factors: moving after being still Relieving factors: heat   PRECAUTIONS: None  WEIGHT BEARING RESTRICTIONS: No  FALLS:  Has patient fallen in last 6 months? Yes. Number of falls 2 dogs  knocked her down once; tripped down the steps once   LIVING ENVIRONMENT: Lives with: lives with their family and lives with their son Lives in: House/apartment Stairs: Yes: Internal: 14 steps; can reach right and External: 5 steps; can reach both Has following equipment at home: None  OCCUPATION: desk/computer 8+ hours/day for 28 years; household chores; 58 yr old son; dogs; sedentary   PLOF: Independent  PATIENT GOALS: reduce pain and learn tools to prevent recurrent pain; become stronger   NEXT MD VISIT: none scheduled   OBJECTIVE:   EDEMA:  No  significant edema noted Rt knee   MUSCLE LENGTH: Hamstrings: Right 70 deg; Left 75 deg Thomas test: Right neutral only; Left neutral only  POSTURE: rounded shoulders, forward head, and increased lumbar lordosis  PALPATION: Muscular tightness medial Rt knee to distal hip adductors Tightness rectus; transverse abdominals; fascia transverse abdominals/lats  Additional palpation deferred   06/24/22: tightness noted through the pecs; anterior chest/ribs; upper traps; abs as noted above    LOWER EXTREMITY ROM: WFL's end range tightness noted Rt knee flexion no pain        Tight hamstrings; piriformis; hip adductors; ITB Rt > Lt   06/24/22: Trunk ROM:  Trunk flexion 70% pulling posterior thorax Extension 50% pulling abs   LOWER EXTREMITY MMT:  MMT Right eval Left eval  Hip flexion 5- 5  Hip extension 4 4  Hip abduction 4 5-  Hip adduction 4+ 5-  Hip internal rotation    Hip external rotation    Knee flexion 5 5  Knee extension 5 5  Ankle dorsiflexion    Ankle plantarflexion 5-  5  Ankle inversion    Ankle eversion     (Blank rows = not tested)  LOWER EXTREMITY SPECIAL TESTS:  Knee special tests: Anterior drawer test: negative and Posterior drawer test: negative; medial/lateral joint stress negative; patellar tracking ~ symmetrical bilat tracking laterally to medially at full extension   FUNCTIONAL TESTS:  5 times sit to stand:   GAIT: Distance walked: 40 Assistive device utilized: None Level of assistance: Complete Independence Comments: WNL's no notable limp   [facility] Adult PT Treatment:                                                DATE: 07/01/2022 Therapeutic Exercise: Scap squeeze prone 5 sec x 5 Cat cow 3 sec x 5 Child's pose 20 sec x 3  Manual Therapy: STM posterior shoulder girdle Thoracic spinal mobs PA and lateral glides Skilled palpation to assess response to manual work and DN  Trigger Point Dry-Needling  Treatment instructions: Expect mild to moderate  muscle soreness. S/S of pneumothorax if dry needled over a lung field, and to seek immediate medical attention should they occur. Patient verbalized understanding of these instructions and education.  Patient Consent Given: Yes Education handout provided: Previously provided Muscles treated: thoracic paraspinals Rt ~ T4-6  Electrical stimulation performed: Yes Parameters:  m Amp current; intensity to pt tolerance   Treatment response/outcome: decreased palpable tightness  Modalities:   Moist heat x 10 min    [facility] Adult PT Treatment:  DATE: 06/26/2022 Therapeutic Exercise: Recumbent bike warm-up L7 x Corner pec stretch x30" --> Doorway pec stretch 3 positions 3x30" Supine on foam roller: angel arms, bent elbow arm circles, hug/t-stretch, core marching Self-TPR with theracane R scapula --> myofascial release pecs Seated thoracic extension with deflated coregeous ball Shoulder ER isometric step out GTB (R) x10 Seated shoulder in abduction (elbow propped on FR) YTB x10 Manual Therapy: STM posterior shoulder girdle    [facility] Adult PT Treatment:                                                DATE: 06/21/2022 Therapeutic Exercise: Recumbent bike L7 x 5 min S/L TFL/ITB stretch (R) Gastroc/soleus stretches against wall --> added toe extension Myofascial self-massage: standing tennis ball R TFL --> foam roller ITB & gastroc Heel raises parallel & IR --> bilateral heel raises/eccentric lower on R Manual Therapy: IASTM/STM R medial gastroc, L distal quad/ITB   PATIENT EDUCATION:  Access Code: [email or web address] URL: [email or web address] Date: 07/01/2022 Prepared by: Corlis Leak  Exercises - Hooklying Hamstring Stretch with Strap  - 2 x daily - 7 x weekly - 1 sets - 3 reps - 30 sec  hold - Supine ITB Stretch with Strap  - 2 x daily - 7 x weekly - 1 sets - 3 reps - 30 sec  hold - Hip Adductors and Hamstring Stretch with Strap  - 2 x  daily - 7 x weekly - 1 sets - 3 reps - 30 sec  hold - Supine Piriformis Stretch with Leg Straight  - 2 x daily - 7 x weekly - 1 sets - 3 reps - 30 sec  hold - Hip Flexor Stretch at Edge of Bed  - 2 x daily - 7 x weekly - 1 sets - 3 reps - 30 sec  hold - Supine Diaphragmatic Breathing  - 2 x daily - 7 x weekly - 1 sets - 10 reps - 4-6 sec  hold - Seated Hip Flexor Stretch  - 2 x daily - 7 x weekly - 1 sets - 3 reps - 30 sec  hold - Short Arc Quad with [employer]  - 1 x daily - 7 x weekly - 2 sets - 10 reps - Long Sitting Hamstring Set  - 1 x daily - 7 x weekly - 2 sets - 10 reps - Sidelying Hip Adduction  - 1 x daily - 7 x weekly - 2 sets - 10 reps - Supine Bridge with Mini Swiss Ball Between Knees  - 1 x daily - 7 x weekly - 3 sets - 10 reps - Figure 4 Bridge  - 1 x daily - 7 x weekly - 3 sets - 10 reps - Seated Long Arc Quad with Hip Adduction  - 1 x daily - 7 x weekly - 3 sets - 10 reps - Standing Calf Raise With Small Ball at Heels  - 1 x daily - 7 x weekly - 3 sets - 10 reps - Wall Squat Hold with Ball  - 1 x daily - 7 x weekly - 3 sets - 10 reps - Standing Gastroc Stretch on Foam 1/2 Roll  - 1 x daily - 7 x weekly - 3 sets - 10 reps - Standing Soleus Stretch on Foam 1/2 Roll  - 1 x daily - 7 x weekly -  3 sets - 10 reps - Doorway Pec Stretch at 60 Degrees Abduction  - 3 x daily - 7 x weekly - 1 sets - 3 reps - Doorway Pec Stretch at 90 Degrees Abduction  - 3 x daily - 7 x weekly - 1 sets - 3 reps - 30 seconds  hold - Doorway Pec Stretch at 120 Degrees Abduction  - 3 x daily - 7 x weekly - 1 sets - 3 reps - 30 second hold  hold - Seated Thoracic Lumbar Extension  - 2 x daily - 7 x weekly - 1 sets - 3-5 reps - 5-10 sec  hold - Supine Chest Stretch on Foam Roll  - 2 x daily - 7 x weekly - 1 sets - 1 reps - 2-5 min  sec  hold - Prone Transversus Abdominus Contraction  - 2 x daily - 7 x weekly - 2-5 min  hold - Seated Cervical Retraction  - 2 x daily - 7 x weekly - 1-2 sets - 5-10 reps - 10 sec   hold - Seated Scapular Retraction  - 2 x daily - 7 x weekly - 1-2 sets - 10 reps - 10 sec  hold - Shoulder External Rotation and Scapular Retraction  - 3 x daily - 7 x weekly - 1 sets - 10 reps - 3-5 sec   hold - Prone Scapular Retraction  - 2 x daily - 7 x weekly - 1 sets - 5-10 reps - 3-5 sec  hold - Cat Cow  - 2 x daily - 7 x weekly - 1 sets - 5-10 reps - 3-5 sec  hold - Cat Cow to Child's Pose  - 2 x daily - 7 x weekly - 1 sets - 3 reps - 30 sec  hold  ASSESSMENT:  CLINICAL IMPRESSION: Continued "catch" and discomfort in the midback area. Trial of joint mobs, manual work , DN for thoracic spine. Continued with posterior shoudler girdle strengthening in prone and stretching with cat caw and child's pose.    From eval: Patient is a 52 y.o. female who was seen today for physical therapy evaluation and treatment for acute Rt knee pain, history of chronic pain including chest wall pain and Rt shoulder/neck pain leading to headaches. Patient has poor posture and alignment; postural habits contributing to knee pain; limited Rt > Lt mobility/tissue extensibility; muscular tightness to palpation; weakness; sedentary lifestyle. Patient will benefit from PT to address problems identified.   OBJECTIVE IMPAIRMENTS: decreased activity tolerance, decreased balance, decreased mobility, difficulty walking, decreased ROM, decreased strength, increased edema, increased fascial restrictions, impaired flexibility, improper body mechanics, postural dysfunction, and pain.    GOALS: Goals reviewed with patient? Yes  SHORT TERM GOALS: Target date: 07/08/2022  Independent in initial HEP Baseline: Goal status: INITIAL  2.  Patient reports beginning aerobic exercises at least 3 times for week for 15-20 min  Baseline:  Goal status: INITIAL   LONG TERM GOALS: Target date: 08/05/2022  Increase tissue extensibility and mobility in bilat LE's  Baseline:  Goal status: INITIAL  2.  5/5 strength bilat LE's   Baseline:  Goal status: INITIAL  3.  Improve posture and alignment with patient demonstrating improved upright posture with posterior shoulder girdle engaged  Baseline:  Goal status: INITIAL  4.  Decrease pain in Rt knee by 50-75% allowing patient to stand and work with less pain  Baseline:  Goal status: INITIAL  5.  Decrease sensation of tightness through chest wall by 50-75%  allowing patient to perform deep breath without limitation  Baseline:  Goal status: INITIAL  6.  Independent tin HEP including aquatic program as indicated  Baseline:  Goal status: INITIAL   PLAN:  PT FREQUENCY: 2x/week  PT DURATION: 8 weeks  PLANNED INTERVENTIONS: Therapeutic exercises, Therapeutic activity, Neuromuscular re-education, Balance training, Gait training, Patient/Family education, Self Care, Joint mobilization, Stair training, Aquatic Therapy, Dry Needling, Electrical stimulation, Cryotherapy, Moist heat, Taping, Ultrasound, Ionotophoresis 4mg /ml Dexamethasone, Manual therapy, and Re-evaluation  PLAN FOR NEXT SESSION: Chest stretching, posterior shoulder strengthening. Progress exercises; postural and body mechanics education; manual work, DN, modalities as indicated   Carlynn Herald, PTA 07/01/2022 8:25 AM

## 2022-07-03 ENCOUNTER — Encounter: Payer: Self-pay | Admitting: Rehabilitative and Restorative Service Providers"

## 2022-07-03 ENCOUNTER — Ambulatory Visit: Payer: BC Managed Care – PPO | Admitting: Rehabilitative and Restorative Service Providers"

## 2022-07-03 DIAGNOSIS — R0789 Other chest pain: Secondary | ICD-10-CM

## 2022-07-03 DIAGNOSIS — M25561 Pain in right knee: Secondary | ICD-10-CM | POA: Diagnosis not present

## 2022-07-03 DIAGNOSIS — M6281 Muscle weakness (generalized): Secondary | ICD-10-CM

## 2022-07-03 DIAGNOSIS — R29898 Other symptoms and signs involving the musculoskeletal system: Secondary | ICD-10-CM

## 2022-07-03 NOTE — Therapy (Signed)
OUTPATIENT PHYSICAL THERAPY LOWER EXTREMITY TREATMENT   Patient Name: Veronica Pennington MRN: [id number] DOB:January 19, 1971, 52 y.o., female Today's Date: 07/03/2022  END OF SESSION:  PT End of Session - 07/03/22 0804     Visit Number 8    Number of Visits 16    Date for PT Re-Evaluation 08/05/22    Authorization Type [employer]    PT Start Time 0800    PT Stop Time 0849    PT Time Calculation (min) 49 min    Activity Tolerance Patient tolerated treatment well             Past Medical History:  Diagnosis Date   Anxiety    Chest wall pain, chronic 11/10/2012   Costochondritis    Family history of adverse reaction to anesthesia    mother has nausea   Headache 05/05/2008   Formatting of this note might be different from the original. Headache  ICD-10 cut over   Mixed hyperlipidemia 11/11/2018   Palpitations 11/25/2012   Pectus excavatum 11/10/2012   Peptic ulcer 05/05/2008   Formatting of this note might be different from the original. Peptic Ulcer  10/1 IMO update   Recurrent boils 10/25/2019   Stress incontinence of urine 07/17/2020   Vaginal discharge 02/19/2017   Past Surgical History:  Procedure Laterality Date   EXTRACORPOREAL SHOCK WAVE LITHOTRIPSY Left 05/15/2020   Procedure: EXTRACORPOREAL SHOCK WAVE LITHOTRIPSY (ESWL);  Surgeon: Jannifer Hick, MD;  Location: [facility];  Service: Urology;  Laterality: Left;   NO PAST SURGERIES     Patient Active Problem List   Diagnosis Date Noted   Alcohol use disorder in remission 04/15/2022   Weakness 04/15/2022   Loose stools 04/15/2022   Sinus bradycardia 07/19/2020   Dizziness 07/19/2020   Stress incontinence of urine 07/17/2020   Family history of adverse reaction to anesthesia    Anxiety 10/25/2019   Costochondritis 10/25/2019   Recurrent boils 10/25/2019   Mixed hyperlipidemia 11/11/2018   Vaginal discharge 02/19/2017   Palpitations 11/25/2012   Chest wall pain, chronic 11/10/2012   Pectus excavatum  11/10/2012   Headache 05/05/2008   Peptic ulcer 05/05/2008    PCP: Christen Butter, NP  REFERRING PROVIDER: Christen Butter, NP  REFERRING DIAG: Acute Rt knee pain   THERAPY DIAG:  Acute pain of right knee  Other symptoms and signs involving the musculoskeletal system  Chest wall pain  Muscle weakness (generalized)  Rationale for Evaluation and Treatment: Rehabilitation  ONSET DATE: 04/12/22  SUBJECTIVE:   SUBJECTIVE STATEMENT: Patient states she does not have the catch in the Rt shoulder blade area today. She has been walking the last two nights to increase her activity level. She has not worked on the exercises. Can't tell if the dry needling and manual work helped.   From eval: Patient reports that she started having Rt knee pain in March with no known injury. Dogs ran into the outside of the Rt knee which created soreness. Now has aching and tightness in the Rt knee on a constant basis. Some times symptoms are worse than others. Most of the pain in on the medial side of the knee. Patient also has history of chronic pain in the chest wall for the past 20 years and pain in the shoulders, neck and feet and shins.  PERTINENT HISTORY: Chronic pain - neck, shoulders, chest wall, feet, shins ~ 20 years; anxiety PAIN:  Are you having pain? Yes:  PAIN:  Are you having pain? Yes: Pain  location: 0/10  Rt knee  Midback 0/10  Pain description: aching and tight Aggravating factors: moving after being still Relieving factors: heat   PRECAUTIONS: None  WEIGHT BEARING RESTRICTIONS: No  FALLS:  Has patient fallen in last 6 months? Yes. Number of falls 2 dogs knocked her down once; tripped down the steps once   LIVING ENVIRONMENT: Lives with: lives with their family and lives with their son Lives in: House/apartment Stairs: Yes: Internal: 14 steps; can reach right and External: 5 steps; can reach both Has following equipment at home: None  OCCUPATION: desk/computer 8+ hours/day for 28  years; household chores; 31 yr old son; dogs; sedentary   PLOF: Independent  PATIENT GOALS: reduce pain and learn tools to prevent recurrent pain; become stronger   NEXT MD VISIT: none scheduled   OBJECTIVE:   EDEMA:  No significant edema noted Rt knee   MUSCLE LENGTH: Hamstrings: Right 70 deg; Left 75 deg Thomas test: Right neutral only; Left neutral only  POSTURE: rounded shoulders, forward head, and increased lumbar lordosis  PALPATION: Muscular tightness medial Rt knee to distal hip adductors Tightness rectus; transverse abdominals; fascia transverse abdominals/lats  Additional palpation deferred   06/24/22: tightness noted through the pecs; anterior chest/ribs; upper traps; abs as noted above    LOWER EXTREMITY ROM: WFL's end range tightness noted Rt knee flexion no pain        Tight hamstrings; piriformis; hip adductors; ITB Rt > Lt   06/24/22: Trunk ROM:  Trunk flexion 70% pulling posterior thorax Extension 50% pulling abs   LOWER EXTREMITY MMT:  MMT Right eval Left eval  Hip flexion 5- 5  Hip extension 4 4  Hip abduction 4 5-  Hip adduction 4+ 5-  Hip internal rotation    Hip external rotation    Knee flexion 5 5  Knee extension 5 5  Ankle dorsiflexion    Ankle plantarflexion 5-  5  Ankle inversion    Ankle eversion     (Blank rows = not tested)  LOWER EXTREMITY SPECIAL TESTS:  Knee special tests: Anterior drawer test: negative and Posterior drawer test: negative; medial/lateral joint stress negative; patellar tracking ~ symmetrical bilat tracking laterally to medially at full extension   FUNCTIONAL TESTS:  5 times sit to stand:   GAIT: Distance walked: 40 Assistive device utilized: None Level of assistance: Complete Independence Comments: WNL's no notable limp   [facility] Adult PT Treatment:                                                DATE: 07/03/2022 Therapeutic Exercise: UBE L5 x 2 min fwd  Doorway stretch 30 sec x 2 reps 3 positions Chin  tuck 5 sec x 5  Scap squeeze standing 5 sec x 5 L's with noodle red TB 3 sec x 20 (some shoulder discomfort) sent yellow TB home  Supine lat stretch 30 sec x 3  Sitting thoracic extension 10 sec x 5 with coregeous ball  Deep breathing  Manual Therapy: STM/manual techniques through chest/thoracic/abdominal area pt supine  Thoracic mobs through ribs  Sternal release  PROM/stretch bilat shoulders into flexion    [facility] Adult PT Treatment:  DATE: 07/01/2022 Therapeutic Exercise: Scap squeeze prone 5 sec x 5 Cat cow 3 sec x 5 Child's pose 20 sec x 3  Manual Therapy: STM posterior shoulder girdle Thoracic spinal mobs PA and lateral glides Skilled palpation to assess response to manual work and DN  Trigger Point Dry-Needling  Treatment instructions: Expect mild to moderate muscle soreness. S/S of pneumothorax if dry needled over a lung field, and to seek immediate medical attention should they occur. Patient verbalized understanding of these instructions and education.  Patient Consent Given: Yes Education handout provided: Previously provided Muscles treated: thoracic paraspinals Rt ~ T4-6  Electrical stimulation performed: Yes Parameters:  m Amp current; intensity to pt tolerance   Treatment response/outcome: decreased palpable tightness  Modalities:   Moist heat x 10 min    [facility] Adult PT Treatment:                                                DATE: 06/26/2022 Therapeutic Exercise: Recumbent bike warm-up L7 x Corner pec stretch x30" --> Doorway pec stretch 3 positions 3x30" Supine on foam roller: angel arms, bent elbow arm circles, hug/t-stretch, core marching Self-TPR with theracane R scapula --> myofascial release pecs Seated thoracic extension with deflated coregeous ball Shoulder ER isometric step out GTB (R) x10 Seated shoulder in abduction (elbow propped on FR) YTB x10 Manual Therapy: STM posterior shoulder  girdle   PATIENT EDUCATION:  Access Code: [email or web address] URL: [email or web address] Date: 07/01/2022 Prepared by: Corlis Leak  Exercises - Hooklying Hamstring Stretch with Strap  - 2 x daily - 7 x weekly - 1 sets - 3 reps - 30 sec  hold - Supine ITB Stretch with Strap  - 2 x daily - 7 x weekly - 1 sets - 3 reps - 30 sec  hold - Hip Adductors and Hamstring Stretch with Strap  - 2 x daily - 7 x weekly - 1 sets - 3 reps - 30 sec  hold - Supine Piriformis Stretch with Leg Straight  - 2 x daily - 7 x weekly - 1 sets - 3 reps - 30 sec  hold - Hip Flexor Stretch at Edge of Bed  - 2 x daily - 7 x weekly - 1 sets - 3 reps - 30 sec  hold - Supine Diaphragmatic Breathing  - 2 x daily - 7 x weekly - 1 sets - 10 reps - 4-6 sec  hold - Seated Hip Flexor Stretch  - 2 x daily - 7 x weekly - 1 sets - 3 reps - 30 sec  hold - Short Arc Quad with [employer]  - 1 x daily - 7 x weekly - 2 sets - 10 reps - Long Sitting Hamstring Set  - 1 x daily - 7 x weekly - 2 sets - 10 reps - Sidelying Hip Adduction  - 1 x daily - 7 x weekly - 2 sets - 10 reps - Supine Bridge with Mini Swiss Ball Between Knees  - 1 x daily - 7 x weekly - 3 sets - 10 reps - Figure 4 Bridge  - 1 x daily - 7 x weekly - 3 sets - 10 reps - Seated Long Arc Quad with Hip Adduction  - 1 x daily - 7 x weekly - 3 sets - 10 reps - Standing Calf Raise With Small Ball at  Heels  - 1 x daily - 7 x weekly - 3 sets - 10 reps - Wall Squat Hold with Ball  - 1 x daily - 7 x weekly - 3 sets - 10 reps - Standing Gastroc Stretch on Foam 1/2 Roll  - 1 x daily - 7 x weekly - 3 sets - 10 reps - Standing Soleus Stretch on Foam 1/2 Roll  - 1 x daily - 7 x weekly - 3 sets - 10 reps - Doorway Pec Stretch at 60 Degrees Abduction  - 3 x daily - 7 x weekly - 1 sets - 3 reps - Doorway Pec Stretch at 90 Degrees Abduction  - 3 x daily - 7 x weekly - 1 sets - 3 reps - 30 seconds  hold - Doorway Pec Stretch at 120 Degrees Abduction  - 3 x daily - 7 x weekly - 1 sets -  3 reps - 30 second hold  hold - Seated Thoracic Lumbar Extension  - 2 x daily - 7 x weekly - 1 sets - 3-5 reps - 5-10 sec  hold - Supine Chest Stretch on Foam Roll  - 2 x daily - 7 x weekly - 1 sets - 1 reps - 2-5 min  sec  hold - Prone Transversus Abdominus Contraction  - 2 x daily - 7 x weekly - 2-5 min  hold - Seated Cervical Retraction  - 2 x daily - 7 x weekly - 1-2 sets - 5-10 reps - 10 sec  hold - Seated Scapular Retraction  - 2 x daily - 7 x weekly - 1-2 sets - 10 reps - 10 sec  hold - Shoulder External Rotation and Scapular Retraction  - 3 x daily - 7 x weekly - 1 sets - 10 reps - 3-5 sec   hold - Prone Scapular Retraction  - 2 x daily - 7 x weekly - 1 sets - 5-10 reps - 3-5 sec  hold - Cat Cow  - 2 x daily - 7 x weekly - 1 sets - 5-10 reps - 3-5 sec  hold - Cat Cow to Child's Pose  - 2 x daily - 7 x weekly - 1 sets - 3 reps - 30 sec  hold  ASSESSMENT:  CLINICAL IMPRESSION: Decreased  "catch" and discomfort in the midback area. Trial of manual work through abdominals and chest wall. Review of exercises and suggestions for managing exercises for LE and thoracic spine. Continued with posterior shoudler girdle strengthening in standing and sitting.    From eval: Patient is a 52 y.o. female who was seen today for physical therapy evaluation and treatment for acute Rt knee pain, history of chronic pain including chest wall pain and Rt shoulder/neck pain leading to headaches. Patient has poor posture and alignment; postural habits contributing to knee pain; limited Rt > Lt mobility/tissue extensibility; muscular tightness to palpation; weakness; sedentary lifestyle. Patient will benefit from PT to address problems identified.   OBJECTIVE IMPAIRMENTS: decreased activity tolerance, decreased balance, decreased mobility, difficulty walking, decreased ROM, decreased strength, increased edema, increased fascial restrictions, impaired flexibility, improper body mechanics, postural dysfunction, and  pain.    GOALS: Goals reviewed with patient? Yes  SHORT TERM GOALS: Target date: 07/08/2022  Independent in initial HEP Baseline: Goal status: INITIAL  2.  Patient reports beginning aerobic exercises at least 3 times for week for 15-20 min  Baseline:  Goal status: INITIAL   LONG TERM GOALS: Target date: 08/05/2022  Increase tissue extensibility and  mobility in bilat LE's  Baseline:  Goal status: INITIAL  2.  5/5 strength bilat LE's  Baseline:  Goal status: INITIAL  3.  Improve posture and alignment with patient demonstrating improved upright posture with posterior shoulder girdle engaged  Baseline:  Goal status: INITIAL  4.  Decrease pain in Rt knee by 50-75% allowing patient to stand and work with less pain  Baseline:  Goal status: INITIAL  5.  Decrease sensation of tightness through chest wall by 50-75% allowing patient to perform deep breath without limitation  Baseline:  Goal status: INITIAL  6.  Independent tin HEP including aquatic program as indicated  Baseline:  Goal status: INITIAL   PLAN:  PT FREQUENCY: 2x/week  PT DURATION: 8 weeks  PLANNED INTERVENTIONS: Therapeutic exercises, Therapeutic activity, Neuromuscular re-education, Balance training, Gait training, Patient/Family education, Self Care, Joint mobilization, Stair training, Aquatic Therapy, Dry Needling, Electrical stimulation, Cryotherapy, Moist heat, Taping, Ultrasound, Ionotophoresis 4mg /ml Dexamethasone, Manual therapy, and Re-evaluation  PLAN FOR NEXT SESSION: Chest stretching, posterior shoulder strengthening. Progress exercises; postural and body mechanics education; manual work, DN, modalities as indicated   Carlynn Herald, PTA 07/03/2022 8:05 AM

## 2022-07-10 ENCOUNTER — Ambulatory Visit: Payer: BC Managed Care – PPO | Admitting: Rehabilitative and Restorative Service Providers"

## 2022-07-10 ENCOUNTER — Encounter: Payer: Self-pay | Admitting: Rehabilitative and Restorative Service Providers"

## 2022-07-10 DIAGNOSIS — M25561 Pain in right knee: Secondary | ICD-10-CM

## 2022-07-10 DIAGNOSIS — M6281 Muscle weakness (generalized): Secondary | ICD-10-CM

## 2022-07-10 DIAGNOSIS — R29898 Other symptoms and signs involving the musculoskeletal system: Secondary | ICD-10-CM

## 2022-07-10 DIAGNOSIS — R0789 Other chest pain: Secondary | ICD-10-CM

## 2022-07-10 NOTE — Therapy (Signed)
OUTPATIENT PHYSICAL THERAPY LOWER EXTREMITY TREATMENT   Patient Name: Veronica Pennington MRN: [id number] DOB:[birth year], 52 y.o., female Today's Date: 07/10/2022  END OF SESSION:  PT End of Session - 07/10/22 0804     Visit Number 9    Number of Visits 16    Date for PT Re-Evaluation 08/05/22    Authorization Type [employer]    PT Start Time 0800    PT Stop Time 0848    PT Time Calculation (min) 48 min    Activity Tolerance Patient tolerated treatment well             Past Medical History:  Diagnosis Date   Anxiety    Chest wall pain, chronic 11/10/2012   Costochondritis    Family history of adverse reaction to anesthesia    mother has nausea   Headache 05/05/2008   Formatting of this note might be different from the original. Headache  ICD-10 cut over   Mixed hyperlipidemia 11/11/2018   Palpitations 11/25/2012   Pectus excavatum 11/10/2012   Peptic ulcer 05/05/2008   Formatting of this note might be different from the original. Peptic Ulcer  10/1 IMO update   Recurrent boils 10/25/2019   Stress incontinence of urine 07/17/2020   Vaginal discharge 02/19/2017   Past Surgical History:  Procedure Laterality Date   EXTRACORPOREAL SHOCK WAVE LITHOTRIPSY Left 05/15/2020   Procedure: EXTRACORPOREAL SHOCK WAVE LITHOTRIPSY (ESWL);  Surgeon: Jannifer Hick, MD;  Location: [facility];  Service: Urology;  Laterality: Left;   NO PAST SURGERIES     Patient Active Problem List   Diagnosis Date Noted   Alcohol use disorder in remission 04/15/2022   Weakness 04/15/2022   Loose stools 04/15/2022   Sinus bradycardia 07/19/2020   Dizziness 07/19/2020   Stress incontinence of urine 07/17/2020   Family history of adverse reaction to anesthesia    Anxiety 10/25/2019   Costochondritis 10/25/2019   Recurrent boils 10/25/2019   Mixed hyperlipidemia 11/11/2018   Vaginal discharge 02/19/2017   Palpitations 11/25/2012   Chest wall pain, chronic 11/10/2012   Pectus excavatum  11/10/2012   Headache 05/05/2008   Peptic ulcer 05/05/2008    PCP: Christen Butter, NP  REFERRING PROVIDER: Christen Butter, NP  REFERRING DIAG: Acute Rt knee pain   THERAPY DIAG:  Acute pain of right knee  Other symptoms and signs involving the musculoskeletal system  Chest wall pain  Muscle weakness (generalized)  Rationale for Evaluation and Treatment: Rehabilitation  ONSET DATE: 04/12/22  SUBJECTIVE:   SUBJECTIVE STATEMENT: Patient states she has some tightness and pain in the Lt rib cage more in the front in the past couple of days. The catch in the Rt shoulder blade area is better. Still has some tightness in the top of the Rt shoulder. She has been walking more to increase her activity level. Knee is feeling better. She still "feels it" sometimes.  She has not worked on the exercises. Can't tell if the dry needling and manual work helped.   From eval: Patient reports that she started having Rt knee pain in March with no known injury. Dogs ran into the outside of the Rt knee which created soreness. Now has aching and tightness in the Rt knee on a constant basis. Some times symptoms are worse than others. Most of the pain in on the medial side of the knee. Patient also has history of chronic pain in the chest wall for the past 20 years and pain in the shoulders, neck  and feet and shins.  PERTINENT HISTORY: Chronic pain - neck, shoulders, chest wall, feet, shins ~ 20 years; anxiety PAIN:  Are you having pain? Yes:  PAIN:  Are you having pain? Yes: Pain location: 1/10 with bending to end range, no pain with walking  Rt knee  Midback 7/10 (Lt front ribs) - sore to touch all ribs front and back  Pain description: aching and tight Aggravating factors: moving after being still Relieving factors: heat   PRECAUTIONS: None  WEIGHT BEARING RESTRICTIONS: No  FALLS:  Has patient fallen in last 6 months? Yes. Number of falls 2 dogs knocked her down once; tripped down the steps once    LIVING ENVIRONMENT: Lives with: lives with their family and lives with their son Lives in: House/apartment Stairs: Yes: Internal: 14 steps; can reach right and External: 5 steps; can reach both Has following equipment at home: None  OCCUPATION: desk/computer 8+ hours/day for 28 years; household chores; 58 yr old son; dogs; sedentary   PLOF: Independent  PATIENT GOALS: reduce pain and learn tools to prevent recurrent pain; become stronger   NEXT MD VISIT: none scheduled   OBJECTIVE:   EDEMA:  No significant edema noted Rt knee   MUSCLE LENGTH: Hamstrings: Right 70 deg; Left 75 deg Thomas test: Right neutral only; Left neutral only  POSTURE: rounded shoulders, forward head, and increased lumbar lordosis  PALPATION: Muscular tightness medial Rt knee to distal hip adductors Tightness rectus; transverse abdominals; fascia transverse abdominals/lats  Additional palpation deferred   06/24/22: tightness noted through the pecs; anterior chest/ribs; upper traps; abs as noted above    LOWER EXTREMITY ROM: WFL's end range tightness noted Rt knee flexion no pain        Tight hamstrings; piriformis; hip adductors; ITB Rt > Lt   06/24/22: Trunk ROM:  Trunk flexion 70% pulling posterior thorax Extension 50% pulling abs   LOWER EXTREMITY MMT:  MMT Right eval Left eval  Hip flexion 5- 5  Hip extension 4 4  Hip abduction 4 5-  Hip adduction 4+ 5-  Hip internal rotation    Hip external rotation    Knee flexion 5 5  Knee extension 5 5  Ankle dorsiflexion    Ankle plantarflexion 5-  5  Ankle inversion    Ankle eversion     (Blank rows = not tested)  LOWER EXTREMITY SPECIAL TESTS:  Knee special tests: Anterior drawer test: negative and Posterior drawer test: negative; medial/lateral joint stress negative; patellar tracking ~ symmetrical bilat tracking laterally to medially at full extension   FUNCTIONAL TESTS:  5 times sit to stand:   GAIT: Distance walked: 40 Assistive  device utilized: None Level of assistance: Complete Independence Comments: WNL's no notable limp   [facility] Adult PT Treatment:                                                DATE: 07/10/2022 Therapeutic Exercise: UBE L5 x 2 min fwd; 2 min back Supine shoulder flexion 3 sec x 10 R/L  Marching supine alternating LE's 3 sec x 10 R/L  Dead bug 3 sec x 10 R/L Trunk rotation supine 5 sec x 5 to pt tolerance  Rolling over green swiss ball on low table to stretch trunk 30 sec x 3 Seated rolling green swill ball forward to stretch back and sides 30 sec  x 3  Sitting thoracic extension 10 sec x 5 with coregeous ball  Doorway stretch 30 sec x 2 reps 3 positions Deep breathing  Manual Therapy:  Self care:  Myofacial ball release standing Rt shoulder girdle Theracane Rt shoulder area  [facility] Adult PT Treatment:                                                DATE: 07/03/2022 Therapeutic Exercise: UBE L5 x 2 min fwd  Doorway stretch 30 sec x 2 reps 3 positions Chin tuck 5 sec x 5  Scap squeeze standing 5 sec x 5 L's with noodle red TB 3 sec x 20 (some shoulder discomfort) sent yellow TB home  Supine lat stretch 30 sec x 3  Sitting thoracic extension 10 sec x 5 with coregeous ball  Deep breathing  Manual Therapy: STM/manual techniques through chest/thoracic/abdominal area pt supine  Thoracic mobs through ribs  Sternal release  PROM/stretch bilat shoulders into flexion    [facility] Adult PT Treatment:                                                DATE: 07/01/2022 Therapeutic Exercise: Scap squeeze prone 5 sec x 5 Cat cow 3 sec x 5 Child's pose 20 sec x 3  Manual Therapy: STM posterior shoulder girdle Thoracic spinal mobs PA and lateral glides Skilled palpation to assess response to manual work and DN  Trigger Point Dry-Needling  Treatment instructions: Expect mild to moderate muscle soreness. S/S of pneumothorax if dry needled over a lung field, and to seek immediate medical attention should they  occur. Patient verbalized understanding of these instructions and education.  Patient Consent Given: Yes Education handout provided: Previously provided Muscles treated: thoracic paraspinals Rt ~ T4-6  Electrical stimulation performed: Yes Parameters:  m Amp current; intensity to pt tolerance   Treatment response/outcome: decreased palpable tightness  Modalities:   Moist heat x 10 min   PATIENT EDUCATION:  Access Code: [email or web address] URL: [email or web address] Date: 07/10/2022 Prepared by: Corlis Leak  Exercises - Hooklying Hamstring Stretch with Strap  - 1 x daily - 7 x weekly - 1 sets - 3 reps - 30 sec  hold - Supine ITB Stretch with Strap  - 1 x daily - 7 x weekly - 1 sets - 3 reps - 30 sec  hold - Hip Adductors and Hamstring Stretch with Strap  - 1 x daily - 7 x weekly - 1 sets - 3 reps - 30 sec  hold - Supine Piriformis Stretch with Leg Straight  - 1 x daily - 7 x weekly - 1 sets - 3 reps - 30 sec  hold - Hip Flexor Stretch at Edge of Bed  - 1 x daily - 7 x weekly - 1 sets - 3 reps - 30 sec  hold - Supine Diaphragmatic Breathing  - 2 x daily - 7 x weekly - 1 sets - 10 reps - 4-6 sec  hold - Seated Hip Flexor Stretch  - 1 x daily - 7 x weekly - 1 sets - 3 reps - 30 sec  hold - Standing Hip Flexor Stretch  - 1 x daily - 7 x weekly - 1 sets -  3 reps - 30 sec  hold - Supine Bridge with Mini Swiss Ball Between Knees  - 1 x daily - 7 x weekly - 3 sets - 10 reps - Figure 4 Bridge  - 1 x daily - 7 x weekly - 3 sets - 10 reps - Standing Calf Raise With Small Ball at Heels  - 1 x daily - 7 x weekly - 3 sets - 10 reps - Wall Squat Hold with Ball  - 1 x daily - 7 x weekly - 3 sets - 10 reps - Standing Gastroc Stretch on Foam 1/2 Roll  - 1 x daily - 7 x weekly - 3 sets - 10 reps - Standing Soleus Stretch on Foam 1/2 Roll  - 1 x daily - 7 x weekly - 3 sets - 10 reps - Doorway Pec Stretch at 60 Degrees Abduction  - 3 x daily - 7 x weekly - 1 sets - 3 reps - Doorway Pec Stretch at 90  Degrees Abduction  - 3 x daily - 7 x weekly - 1 sets - 3 reps - 30 seconds  hold - Doorway Pec Stretch at 120 Degrees Abduction  - 3 x daily - 7 x weekly - 1 sets - 3 reps - 30 second hold  hold - Seated Thoracic Lumbar Extension  - 2 x daily - 7 x weekly - 1 sets - 3-5 reps - 5-10 sec  hold - Supine Chest Stretch on Foam Roll  - 2 x daily - 7 x weekly - 1 sets - 1 reps - 2-5 min  sec  hold - Seated Cervical Retraction  - 2 x daily - 7 x weekly - 1-2 sets - 5-10 reps - 10 sec  hold - Seated Scapular Retraction  - 2 x daily - 7 x weekly - 1-2 sets - 10 reps - 10 sec  hold - Prone Scapular Retraction  - 2 x daily - 7 x weekly - 1 sets - 5-10 reps - 3-5 sec  hold - Cat Cow  - 2 x daily - 7 x weekly - 1 sets - 5-10 reps - 3-5 sec  hold - Cat Cow to Child's Pose  - 2 x daily - 7 x weekly - 1 sets - 3 reps - 30 sec  hold - Shoulder External Rotation and Scapular Retraction  - 3 x daily - 7 x weekly - 1 sets - 10 reps - 3-5 sec   hold - Shoulder External Rotation and Scapular Retraction with Resistance  - 2 x daily - 7 x weekly - 1 sets - 10 reps - 3-5 sec  hold - Standing Shoulder W at Wall  - 1-2 x daily - 7 x weekly - 1 sets - 10 reps - 3 sec  hold - Shoulder W - External Rotation with Resistance  - 2 x daily - 7 x weekly - 1-2 sets - 10 reps - 3 sec  hold - Supine Alternating Shoulder Flexion  - 1 x daily - 7 x weekly - 1-2 sets - 10 reps - 2 sec  hold - Supine March with Posterior Pelvic Tilt  - 1 x daily - 7 x weekly - 1 sets - 3 reps - 30 sec  hold - Dead Bug  - 1 x daily - 7 x weekly - 1-2 sets - 10 reps - 2 sec  hold - Seated Flexion Stretch with Swiss Ball  - 1 x daily - 7 x weekly -  1 sets - 3 reps - 30 sec  hold  ASSESSMENT:  CLINICAL IMPRESSION: Overall patient is progressing with less knee pain and decreased "catch" in the Rt shoulder blade area. She has decreased mobility through trunk with tightness in the transverse abdominals Lt > Rt with palpation.  Continued exercises today focus on  stretch through the trunk. Review of exercises and added exercises in supine. Offered suggestions for managing exercises for LE and thoracic spine. Continued with posterior shoudler girdle strengthening in standing and sitting.    From eval: Patient is a 52 y.o. female who was seen today for physical therapy evaluation and treatment for acute Rt knee pain, history of chronic pain including chest wall pain and Rt shoulder/neck pain leading to headaches. Patient has poor posture and alignment; postural habits contributing to knee pain; limited Rt > Lt mobility/tissue extensibility; muscular tightness to palpation; weakness; sedentary lifestyle. Patient will benefit from PT to address problems identified.   OBJECTIVE IMPAIRMENTS: decreased activity tolerance, decreased balance, decreased mobility, difficulty walking, decreased ROM, decreased strength, increased edema, increased fascial restrictions, impaired flexibility, improper body mechanics, postural dysfunction, and pain.    GOALS: Goals reviewed with patient? Yes  SHORT TERM GOALS: Target date: 07/08/2022  Independent in initial HEP Baseline: Goal status: INITIAL  2.  Patient reports beginning aerobic exercises at least 3 times for week for 15-20 min  Baseline:  Goal status: INITIAL   LONG TERM GOALS: Target date: 08/05/2022  Increase tissue extensibility and mobility in bilat LE's  Baseline:  Goal status: INITIAL  2.  5/5 strength bilat LE's  Baseline:  Goal status: INITIAL  3.  Improve posture and alignment with patient demonstrating improved upright posture with posterior shoulder girdle engaged  Baseline:  Goal status: INITIAL  4.  Decrease pain in Rt knee by 50-75% allowing patient to stand and work with less pain  Baseline:  Goal status: INITIAL  5.  Decrease sensation of tightness through chest wall by 50-75% allowing patient to perform deep breath without limitation  Baseline:  Goal status: INITIAL  6.   Independent tin HEP including aquatic program as indicated  Baseline:  Goal status: INITIAL   PLAN:  PT FREQUENCY: 2x/week  PT DURATION: 8 weeks  PLANNED INTERVENTIONS: Therapeutic exercises, Therapeutic activity, Neuromuscular re-education, Balance training, Gait training, Patient/Family education, Self Care, Joint mobilization, Stair training, Aquatic Therapy, Dry Needling, Electrical stimulation, Cryotherapy, Moist heat, Taping, Ultrasound, Ionotophoresis 4mg /ml Dexamethasone, Manual therapy, and Re-evaluation  PLAN FOR NEXT SESSION: Chest stretching, posterior shoulder strengthening. Progress exercises; postural and body mechanics education; manual work, DN, modalities as indicated   Carlynn Herald, PTA 07/10/2022 8:05 AM

## 2022-07-12 ENCOUNTER — Ambulatory Visit: Payer: BC Managed Care – PPO

## 2022-07-15 ENCOUNTER — Ambulatory Visit: Payer: BC Managed Care – PPO | Attending: Medical-Surgical

## 2022-07-15 DIAGNOSIS — M6281 Muscle weakness (generalized): Secondary | ICD-10-CM | POA: Insufficient documentation

## 2022-07-15 DIAGNOSIS — R0789 Other chest pain: Secondary | ICD-10-CM | POA: Diagnosis present

## 2022-07-15 DIAGNOSIS — R29898 Other symptoms and signs involving the musculoskeletal system: Secondary | ICD-10-CM | POA: Insufficient documentation

## 2022-07-15 DIAGNOSIS — M25561 Pain in right knee: Secondary | ICD-10-CM | POA: Diagnosis present

## 2022-07-15 NOTE — Therapy (Addendum)
 OUTPATIENT PHYSICAL THERAPY LOWER EXTREMITY TREATMENT  PHYSICAL THERAPY DISCHARGE SUMMARY  Visits from Start of Care: 11  Current functional level related to goals / functional outcomes: See progress note for discharge status    Remaining deficits: Unknown    Education / Equipment: HEP    Patient agrees to discharge. Patient goals were partially met. Patient is being discharged due to not returning since the last visit.  Celyn P. Leonor Liv PT, MPH 04/21/23 8:02 AM     Patient Name: Veronica Pennington MRN: [id number] DOB:09/08/1970, 52 y.o., female Today's Date: 07/15/2022  END OF SESSION:  PT End of Session - 07/15/22 0809     Visit Number 11    Number of Visits 16    Date for PT Re-Evaluation 08/05/22    Authorization Type BCBS    PT Start Time 0809   pt arrived late   PT Stop Time 0845    PT Time Calculation (min) 36 min    Activity Tolerance Patient tolerated treatment well    Behavior During Therapy [facility] for tasks assessed/performed             Past Medical History:  Diagnosis Date   Anxiety    Chest wall pain, chronic 11/10/2012   Costochondritis    Family history of adverse reaction to anesthesia    mother has nausea   Headache 05/05/2008   Formatting of this note might be different from the original. Headache  ICD-10 cut over   Mixed hyperlipidemia 11/11/2018   Palpitations 11/25/2012   Pectus excavatum 11/10/2012   Peptic ulcer 05/05/2008   Formatting of this note might be different from the original. Peptic Ulcer  10/1 IMO update   Recurrent boils 10/25/2019   Stress incontinence of urine 07/17/2020   Vaginal discharge 02/19/2017   Past Surgical History:  Procedure Laterality Date   EXTRACORPOREAL SHOCK WAVE LITHOTRIPSY Left 05/15/2020   Procedure: EXTRACORPOREAL SHOCK WAVE LITHOTRIPSY (ESWL);  Surgeon: Jannifer Hick, MD;  Location: [facility];  Service: Urology;  Laterality: Left;   NO PAST SURGERIES     Patient Active Problem List    Diagnosis Date Noted   Alcohol use disorder in remission 04/15/2022   Weakness 04/15/2022   Loose stools 04/15/2022   Sinus bradycardia 07/19/2020   Dizziness 07/19/2020   Stress incontinence of urine 07/17/2020   Family history of adverse reaction to anesthesia    Anxiety 10/25/2019   Costochondritis 10/25/2019   Recurrent boils 10/25/2019   Mixed hyperlipidemia 11/11/2018   Vaginal discharge 02/19/2017   Palpitations 11/25/2012   Chest wall pain, chronic 11/10/2012   Pectus excavatum 11/10/2012   Headache 05/05/2008   Peptic ulcer 05/05/2008    PCP: Christen Butter, NP  REFERRING PROVIDER: Christen Butter, NP  REFERRING DIAG: Acute Rt knee pain   THERAPY DIAG:  Acute pain of right knee  Other symptoms and signs involving the musculoskeletal system  Chest wall pain  Muscle weakness (generalized)  Rationale for Evaluation and Treatment: Rehabilitation  ONSET DATE: 04/12/22  SUBJECTIVE:   SUBJECTIVE STATEMENT: Patient reports 1/10 pain in knee today, states she has been having an occasional "warmth" in L calf. Patient states top of her R shoulder is still aggravated and has minimal changes in chest wall pain/discomfort.   From eval: Patient reports that she started having Rt knee pain in March with no known injury. Dogs ran into the outside of the Rt knee which created soreness. Now has aching and tightness in the Rt knee  on a constant basis. Some times symptoms are worse than others. Most of the pain in on the medial side of the knee. Patient also has history of chronic pain in the chest wall for the past 20 years and pain in the shoulders, neck and feet and shins.  PERTINENT HISTORY: Chronic pain - neck, shoulders, chest wall, feet, shins ~ 20 years; anxiety PAIN:  Are you having pain? Yes:  PAIN:  Are you having pain? Yes: Pain location: 1/10 with bending to end range, no pain with walking  Rt knee  Midback 7/10 (Lt front ribs) - sore to touch all ribs front and back  Pain  description: aching and tight Aggravating factors: moving after being still Relieving factors: heat   PRECAUTIONS: None  WEIGHT BEARING RESTRICTIONS: No  FALLS:  Has patient fallen in last 6 months? Yes. Number of falls 2 dogs knocked her down once; tripped down the steps once   LIVING ENVIRONMENT: Lives with: lives with their family and lives with their son Lives in: House/apartment Stairs: Yes: Internal: 14 steps; can reach right and External: 5 steps; can reach both Has following equipment at home: None  OCCUPATION: desk/computer 8+ hours/day for 28 years; household chores; 47 yr old son; dogs; sedentary   PLOF: Independent  PATIENT GOALS: reduce pain and learn tools to prevent recurrent pain; become stronger   NEXT MD VISIT: none scheduled   OBJECTIVE:   EDEMA:  No significant edema noted Rt knee   MUSCLE LENGTH: Hamstrings: Right 70 deg; Left 75 deg Thomas test: Right neutral only; Left neutral only  POSTURE: rounded shoulders, forward head, and increased lumbar lordosis  PALPATION: Muscular tightness medial Rt knee to distal hip adductors Tightness rectus; transverse abdominals; fascia transverse abdominals/lats  Additional palpation deferred   06/24/22: tightness noted through the pecs; anterior chest/ribs; upper traps; abs as noted above    LOWER EXTREMITY ROM: WFL's end range tightness noted Rt knee flexion no pain        Tight hamstrings; piriformis; hip adductors; ITB Rt > Lt   06/24/22: Trunk ROM:  Trunk flexion 70% pulling posterior thorax Extension 50% pulling abs   LOWER EXTREMITY MMT:  MMT Right eval Left eval  Hip flexion 5- 5  Hip extension 4 4  Hip abduction 4 5-  Hip adduction 4+ 5-  Hip internal rotation    Hip external rotation    Knee flexion 5 5  Knee extension 5 5  Ankle dorsiflexion    Ankle plantarflexion 5-  5  Ankle inversion    Ankle eversion     (Blank rows = not tested)  LOWER EXTREMITY SPECIAL TESTS:  Knee special  tests: Anterior drawer test: negative and Posterior drawer test: negative; medial/lateral joint stress negative; patellar tracking ~ symmetrical bilat tracking laterally to medially at full extension   FUNCTIONAL TESTS:  5 times sit to stand:   GAIT: Distance walked: 40 Assistive device utilized: None Level of assistance: Complete Independence Comments: WNL's no notable limp   [facility] Adult PT Treatment:                                                DATE: 07/15/2022 Therapeutic Exercise: Doorway pec stretch S/L stretch over green bolster S/L open books Counter shoulder flexion stretch Myofascial release with ball to R pec SCM pin & stretch (R) Supine on  FR: angel arms, bent arm overhead dynamic stretch, hug/t-stretch, scissors 2#DB, circles CW/CCW 2#DB Seated thoracic extension over 1/2 FR Shoulder ER isometric step out GTB --> shoulder ER GTB x6 Yoga gate pose Thred the needle stretch    [facility] Adult PT Treatment:                                                DATE: 07/10/2022 Therapeutic Exercise: UBE L5 x 2 min fwd; 2 min back Supine shoulder flexion 3 sec x 10 R/L  Marching supine alternating LE's 3 sec x 10 R/L  Dead bug 3 sec x 10 R/L Trunk rotation supine 5 sec x 5 to pt tolerance  Rolling over green swiss ball on low table to stretch trunk 30 sec x 3 Seated rolling green swill ball forward to stretch back and sides 30 sec x 3  Sitting thoracic extension 10 sec x 5 with coregeous ball  Doorway stretch 30 sec x 2 reps 3 positions Deep breathing  Manual Therapy:  Self care:  Myofacial ball release standing Rt shoulder girdle Theracane Rt shoulder area    [facility] Adult PT Treatment:                                                DATE: 07/01/2022 Therapeutic Exercise: Scap squeeze prone 5 sec x 5 Cat cow 3 sec x 5 Child's pose 20 sec x 3  Manual Therapy: STM posterior shoulder girdle Thoracic spinal mobs PA and lateral glides Skilled palpation to assess response to  manual work and DN  Trigger Point Dry-Needling  Treatment instructions: Expect mild to moderate muscle soreness. S/S of pneumothorax if dry needled over a lung field, and to seek immediate medical attention should they occur. Patient verbalized understanding of these instructions and education.  Patient Consent Given: Yes Education handout provided: Previously provided Muscles treated: thoracic paraspinals Rt ~ T4-6  Electrical stimulation performed: Yes Parameters:  m Amp current; intensity to pt tolerance   Treatment response/outcome: decreased palpable tightness  Modalities:   Moist heat x 10 min   PATIENT EDUCATION:  Access Code: [email or web address] URL: [email or web address] Date: 07/10/2022 Prepared by: Corlis Leak  Exercises - Hooklying Hamstring Stretch with Strap  - 1 x daily - 7 x weekly - 1 sets - 3 reps - 30 sec  hold - Supine ITB Stretch with Strap  - 1 x daily - 7 x weekly - 1 sets - 3 reps - 30 sec  hold - Hip Adductors and Hamstring Stretch with Strap  - 1 x daily - 7 x weekly - 1 sets - 3 reps - 30 sec  hold - Supine Piriformis Stretch with Leg Straight  - 1 x daily - 7 x weekly - 1 sets - 3 reps - 30 sec  hold - Hip Flexor Stretch at Edge of Bed  - 1 x daily - 7 x weekly - 1 sets - 3 reps - 30 sec  hold - Supine Diaphragmatic Breathing  - 2 x daily - 7 x weekly - 1 sets - 10 reps - 4-6 sec  hold - Seated Hip Flexor Stretch  - 1 x daily - 7 x weekly - 1 sets - 3 reps -  30 sec  hold - Standing Hip Flexor Stretch  - 1 x daily - 7 x weekly - 1 sets - 3 reps - 30 sec  hold - Supine Bridge with Mini Swiss Ball Between Knees  - 1 x daily - 7 x weekly - 3 sets - 10 reps - Figure 4 Bridge  - 1 x daily - 7 x weekly - 3 sets - 10 reps - Standing Calf Raise With Small Ball at Heels  - 1 x daily - 7 x weekly - 3 sets - 10 reps - Wall Squat Hold with Ball  - 1 x daily - 7 x weekly - 3 sets - 10 reps - Standing Gastroc Stretch on Foam 1/2 Roll  - 1 x daily - 7 x weekly - 3 sets -  10 reps - Standing Soleus Stretch on Foam 1/2 Roll  - 1 x daily - 7 x weekly - 3 sets - 10 reps - Doorway Pec Stretch at 60 Degrees Abduction  - 3 x daily - 7 x weekly - 1 sets - 3 reps - Doorway Pec Stretch at 90 Degrees Abduction  - 3 x daily - 7 x weekly - 1 sets - 3 reps - 30 seconds  hold - Doorway Pec Stretch at 120 Degrees Abduction  - 3 x daily - 7 x weekly - 1 sets - 3 reps - 30 second hold  hold - Seated Thoracic Lumbar Extension  - 2 x daily - 7 x weekly - 1 sets - 3-5 reps - 5-10 sec  hold - Supine Chest Stretch on Foam Roll  - 2 x daily - 7 x weekly - 1 sets - 1 reps - 2-5 min  sec  hold - Seated Cervical Retraction  - 2 x daily - 7 x weekly - 1-2 sets - 5-10 reps - 10 sec  hold - Seated Scapular Retraction  - 2 x daily - 7 x weekly - 1-2 sets - 10 reps - 10 sec  hold - Prone Scapular Retraction  - 2 x daily - 7 x weekly - 1 sets - 5-10 reps - 3-5 sec  hold - Cat Cow  - 2 x daily - 7 x weekly - 1 sets - 5-10 reps - 3-5 sec  hold - Cat Cow to Child's Pose  - 2 x daily - 7 x weekly - 1 sets - 3 reps - 30 sec  hold - Shoulder External Rotation and Scapular Retraction  - 3 x daily - 7 x weekly - 1 sets - 10 reps - 3-5 sec   hold - Shoulder External Rotation and Scapular Retraction with Resistance  - 2 x daily - 7 x weekly - 1 sets - 10 reps - 3-5 sec  hold - Standing Shoulder W at Wall  - 1-2 x daily - 7 x weekly - 1 sets - 10 reps - 3 sec  hold - Shoulder W - External Rotation with Resistance  - 2 x daily - 7 x weekly - 1-2 sets - 10 reps - 3 sec  hold - Supine Alternating Shoulder Flexion  - 1 x daily - 7 x weekly - 1-2 sets - 10 reps - 2 sec  hold - Supine March with Posterior Pelvic Tilt  - 1 x daily - 7 x weekly - 1 sets - 3 reps - 30 sec  hold - Dead Bug  - 1 x daily - 7 x weekly - 1-2 sets - 10 reps -  2 sec  hold - Seated Flexion Stretch with Swiss Ball  - 1 x daily - 7 x weekly - 1 sets - 3 reps - 30 sec  hold  ASSESSMENT:  CLINICAL IMPRESSION: Session focused on thoracic  mobility and stretching exercises. Noted bilateral tightness with trunk rotation. Discussion with patient on adding in yoga gate pose and passive supine thoracic extension stretches to address ongoing anterior trunk wall tightness.   From eval: Patient is a 52 y.o. female who was seen today for physical therapy evaluation and treatment for acute Rt knee pain, history of chronic pain including chest wall pain and Rt shoulder/neck pain leading to headaches. Patient has poor posture and alignment; postural habits contributing to knee pain; limited Rt > Lt mobility/tissue extensibility; muscular tightness to palpation; weakness; sedentary lifestyle. Patient will benefit from PT to address problems identified.   OBJECTIVE IMPAIRMENTS: decreased activity tolerance, decreased balance, decreased mobility, difficulty walking, decreased ROM, decreased strength, increased edema, increased fascial restrictions, impaired flexibility, improper body mechanics, postural dysfunction, and pain.    GOALS: Goals reviewed with patient? Yes  SHORT TERM GOALS: Target date: 07/08/2022  Independent in initial HEP Baseline: Goal status: INITIAL  2.  Patient reports beginning aerobic exercises at least 3 times for week for 15-20 min  Baseline:  Goal status: INITIAL   LONG TERM GOALS: Target date: 08/05/2022  Increase tissue extensibility and mobility in bilat LE's  Baseline:  Goal status: INITIAL  2.  5/5 strength bilat LE's  Baseline:  Goal status: INITIAL  3.  Improve posture and alignment with patient demonstrating improved upright posture with posterior shoulder girdle engaged  Baseline:  Goal status: INITIAL  4.  Decrease pain in Rt knee by 50-75% allowing patient to stand and work with less pain  Baseline:  Goal status: INITIAL  5.  Decrease sensation of tightness through chest wall by 50-75% allowing patient to perform deep breath without limitation  Baseline:  Goal status: INITIAL  6.   Independent tin HEP including aquatic program as indicated  Baseline:  Goal status: INITIAL   PLAN:  PT FREQUENCY: 2x/week  PT DURATION: 8 weeks  PLANNED INTERVENTIONS: Therapeutic exercises, Therapeutic activity, Neuromuscular re-education, Balance training, Gait training, Patient/Family education, Self Care, Joint mobilization, Stair training, Aquatic Therapy, Dry Needling, Electrical stimulation, Cryotherapy, Moist heat, Taping, Ultrasound, Ionotophoresis 4mg /ml Dexamethasone, Manual therapy, and Re-evaluation  PLAN FOR NEXT SESSION: Chest stretching, posterior shoulder strengthening. Progress exercises; postural and body mechanics education; manual work, DN, modalities as indicated   Carlynn Herald, PTA 07/15/2022 8:52 AM

## 2022-07-15 NOTE — Progress Notes (Signed)
This encounter was created in error - please disregard.

## 2022-07-17 ENCOUNTER — Ambulatory Visit: Payer: BC Managed Care – PPO | Admitting: Rehabilitative and Restorative Service Providers"

## 2022-07-22 ENCOUNTER — Ambulatory Visit (INDEPENDENT_AMBULATORY_CARE_PROVIDER_SITE_OTHER): Payer: BC Managed Care – PPO | Admitting: Sports Medicine

## 2022-07-22 ENCOUNTER — Ambulatory Visit (INDEPENDENT_AMBULATORY_CARE_PROVIDER_SITE_OTHER): Payer: BC Managed Care – PPO

## 2022-07-22 DIAGNOSIS — M79605 Pain in left leg: Secondary | ICD-10-CM

## 2022-07-22 DIAGNOSIS — M79662 Pain in left lower leg: Secondary | ICD-10-CM | POA: Diagnosis not present

## 2022-07-22 NOTE — Assessment & Plan Note (Signed)
This is a very pleasant 52 year old female, she has been working hard to get in shape, for the past couple weeks she has noted increasing pain described as a warmth or burning sensation lateral lower leg but not quite to the foot.  She does have some mild back aching pains. I do think that we are dealing with a radiculitis. She does have a legitimate concern for lumbar thrombosis. We will add a left lower extremity DVT ultrasound. She prefers a nonpharmacologic approach and she is already in physical therapy for another problem, so we will add home condition, x-rays, return to see me in 6 weeks, if insufficient improvement we will consider MRI and medication.

## 2022-07-22 NOTE — Progress Notes (Signed)
    Procedures performed today:    None.  Independent interpretation of notes and tests performed by another provider:   None.  Brief History, Exam, Impression, and Recommendations:    Left leg pain This is a very pleasant 52 year old female, she has been working hard to get in shape, for the past couple weeks she has noted increasing pain described as a warmth or burning sensation lateral lower leg but not quite to the foot.  She does have some mild back aching pains. I do think that we are dealing with a radiculitis. She does have a legitimate concern for lumbar thrombosis. We will add a left lower extremity DVT ultrasound. She prefers a nonpharmacologic approach and she is already in physical therapy for another problem, so we will add home condition, x-rays, return to see me in 6 weeks, if insufficient improvement we will consider MRI and medication.    ____________________________________________ Ihor Austin. Benjamin Stain, M.D., ABFM., CAQSM., AME. Primary Care and Sports Medicine [facility]  Adjunct Professor of Family Medicine  [employer]  Restaurant manager, fast food

## 2022-09-02 ENCOUNTER — Ambulatory Visit: Payer: BC Managed Care – PPO | Admitting: Sports Medicine

## 2022-09-20 ENCOUNTER — Encounter: Payer: Self-pay | Admitting: Medical-Surgical

## 2022-09-25 ENCOUNTER — Encounter: Payer: Self-pay | Admitting: Medical-Surgical

## 2022-10-07 ENCOUNTER — Ambulatory Visit
Admission: EM | Admit: 2022-10-07 | Discharge: 2022-10-07 | Disposition: A | Discharge status: Home / Self Care | Payer: BC Managed Care – PPO | Attending: Family Medicine | Admitting: Family Medicine

## 2022-10-07 ENCOUNTER — Other Ambulatory Visit: Payer: Self-pay

## 2022-10-07 DIAGNOSIS — U071 COVID-19: Secondary | ICD-10-CM | POA: Diagnosis not present

## 2022-10-07 LAB — POC SARS CORONAVIRUS 2 AG -  ED: SARS Coronavirus 2 Ag: POSITIVE — AB

## 2022-10-07 NOTE — ED Triage Notes (Signed)
Sore throat since Thursday night. Ear fullness, chest congestion, body aches since Saturday. No relief with home interventions (alkaseltzer cold and flu)

## 2022-10-07 NOTE — Discharge Instructions (Addendum)
Drink lots of water OTC medication  Call for problems

## 2022-10-07 NOTE — ED Provider Notes (Signed)
[facility]    CSN: [id number] Arrival date & time: 10/07/22  0848      History   Chief Complaint No chief complaint on file.   HPI Veronica Pennington is a 52 y.o. female.   Patient is in her fourth day of illness.  Sore throat nasal congestion, fever and chills, body aches and fatigue.  She is here requesting a COVID test    Past Medical History:  Diagnosis Date   Anxiety    Chest wall pain, chronic 11/10/2012   Costochondritis    Family history of adverse reaction to anesthesia    mother has nausea   Headache 05/05/2008   Formatting of this note might be different from the original. Headache  ICD-10 cut over   Mixed hyperlipidemia 11/11/2018   Palpitations 11/25/2012   Pectus excavatum 11/10/2012   Peptic ulcer 05/05/2008   Formatting of this note might be different from the original. Peptic Ulcer  10/1 IMO update   Recurrent boils 10/25/2019   Stress incontinence of urine 07/17/2020   Vaginal discharge 02/19/2017    Patient Active Problem List   Diagnosis Date Noted   Left leg pain 07/22/2022   Alcohol use disorder in remission 04/15/2022   Weakness 04/15/2022   Loose stools 04/15/2022   Sinus bradycardia 07/19/2020   Dizziness 07/19/2020   Stress incontinence of urine 07/17/2020   Family history of adverse reaction to anesthesia    Anxiety 10/25/2019   Costochondritis 10/25/2019   Recurrent boils 10/25/2019   Mixed hyperlipidemia 11/11/2018   Vaginal discharge 02/19/2017   Palpitations 11/25/2012   Chest wall pain, chronic 11/10/2012   Pectus excavatum 11/10/2012   Headache 05/05/2008   Peptic ulcer 05/05/2008    Past Surgical History:  Procedure Laterality Date   EXTRACORPOREAL SHOCK WAVE LITHOTRIPSY Left 05/15/2020   Procedure: EXTRACORPOREAL SHOCK WAVE LITHOTRIPSY (ESWL);  Surgeon: Jannifer Hick, MD;  Location: [facility];  Service: Urology;  Laterality: Left;   NO PAST SURGERIES      OB History   No obstetric history  on file.      Home Medications    Prior to Admission medications   Medication Sig Start Date End Date Taking? Authorizing Provider  acetaminophen (TYLENOL) 500 MG tablet Take 500 mg by mouth every 6 (six) hours as needed for moderate pain.    [provider]  ibuprofen (ADVIL,MOTRIN) 200 MG tablet Take 400 mg by mouth every 6 (six) hours as needed for headache.    [provider]    Family History Family History  Problem Relation Age of Onset   Rheum arthritis Mother    Hypertension Mother    Ankylosing spondylitis Mother    Heart attack Father    Hypertension Father    Stroke Maternal Grandfather     Social History Social History   Tobacco Use   Smoking status: Former    Current packs/day: 0.00    Average packs/day: 1 pack/day for 10.0 years (10.0 ttl pk-yrs)    Types: Cigarettes    Start date: 44    Quit date: 1998    Years since quitting: 26.6   Smokeless tobacco: Never  Substance Use Topics   Alcohol use: Not Currently   Drug use: No     Allergies   Other and Latex   Review of Systems Review of Systems  See HPI Physical Exam Triage Vital Signs ED Triage Vitals [10/07/22 0854]  Encounter Vitals Group     BP  125/80     Systolic BP Percentile      Diastolic BP Percentile      Pulse Rate 71     Resp 16     Temp 98.5 F (36.9 C)     Temp src      SpO2 98 %     Weight      Height      Head Circumference      Peak Flow      Pain Score 2     Pain Loc      Pain Education      Exclude from Growth Chart    No data found.  Updated Vital Signs BP 125/80 (BP Location: Right Arm)   Pulse 71   Temp 98.5 F (36.9 C)   Resp 16   LMP 02/16/2018 (Approximate)   SpO2 98%      Physical Exam Constitutional:      General: She is not in acute distress.    Appearance: She is well-developed. She is ill-appearing.  HENT:     Head: Normocephalic and atraumatic.  Eyes:     Conjunctiva/sclera: Conjunctivae normal.     Pupils: Pupils  are equal, round, and reactive to light.  Cardiovascular:     Rate and Rhythm: Normal rate.  Pulmonary:     Effort: Pulmonary effort is normal. No respiratory distress.  Musculoskeletal:        General: Normal range of motion.     Cervical back: Normal range of motion.  Skin:    General: Skin is warm and dry.  Neurological:     Mental Status: She is alert.      UC Treatments / Results  Labs (all labs ordered are listed, but only abnormal results are displayed) Labs Reviewed  POC SARS CORONAVIRUS 2 AG -  ED - Abnormal; Notable for the following components:      Result Value   SARS Coronavirus 2 Ag Positive (*)    All other components within normal limits    EKG   Radiology No results found.  Procedures Procedures (including critical care time)  Medications Ordered in UC Medications - No data to display  Initial Impression / Assessment and Plan / UC Course  I have reviewed the triage vital signs and the nursing notes.  Pertinent labs & imaging results that were available during my care of the patient were reviewed by me and considered in my medical decision making (see chart for details).     Final Clinical Impressions(s) / UC Diagnoses   Final diagnoses:  COVID-19     Discharge Instructions      Drink lots of water OTC medication  Call for problems   ED Prescriptions   None    PDMP not reviewed this encounter.   Eustace Moore, MD 10/07/22 [phone]

## 2023-05-23 ENCOUNTER — Ambulatory Visit: Admitting: Physician Assistant

## 2023-05-23 ENCOUNTER — Encounter: Payer: Self-pay | Admitting: Physician Assistant

## 2023-05-23 ENCOUNTER — Ambulatory Visit: Payer: Self-pay

## 2023-05-23 VITALS — BP 118/74 | HR 87 | Temp 97.9°F | Ht 67.0 in | Wt 168.0 lb

## 2023-05-23 DIAGNOSIS — R051 Acute cough: Secondary | ICD-10-CM | POA: Diagnosis not present

## 2023-05-23 MED ORDER — BENZONATATE 100 MG PO CAPS
100.0000 mg | ORAL_CAPSULE | Freq: Two times a day (BID) | ORAL | 0 refills | Status: DC | PRN
Start: 2023-05-23 — End: 2023-05-29

## 2023-05-23 NOTE — Telephone Encounter (Signed)
 Chief Complaint: cough Symptoms: cough and chest congestion Frequency: x 1 week Pertinent Negatives: Patient denies sob Disposition: [] ED /[] Urgent Care (no appt availability in office) / [x] Appointment(In office/virtual)/ []  [facility]/ [] Home Care/ [] Refused Recommended Disposition /[] [place] [facility]/ []  Follow-up with PCP Additional Notes: pt states that last week she had some sinus pressure and things and then on Saturday she developed at cough.  States cough is moderate and keeps her up at night. States she has taken mucinex. States that she has a burning sensation in her sternum to neck when coughing.   Copied from CRM [id number]. Topic: Clinical - Red Word Triage >> May 23, 2023  9:52 AM Hamdi H wrote: [facility] that prompted transfer to Nurse Triage: Lots of coughing with chest pain. Reason for Disposition  [1] Continuous (nonstop) coughing interferes with work or school AND [2] no improvement using cough treatment per Care Advice  Answer Assessment - Initial Assessment Questions 1. ONSET: "When did the cough begin?"      Saturday 2. SEVERITY: "How bad is the cough today?"      mod 3. SPUTUM: "Describe the color of your sputum" (none, dry cough; clear, white, yellow, green)     white 4. HEMOPTYSIS: "Are you coughing up any blood?" If so ask: "How much?" (flecks, streaks, tablespoons, etc.)     no 5. DIFFICULTY BREATHING: "Are you having difficulty breathing?" If Yes, ask: "How bad is it?" (e.g., mild, moderate, severe)    - MILD: No SOB at rest, mild SOB with walking, speaks normally in sentences, can lie down, no retractions, pulse < 100.    - MODERATE: SOB at rest, SOB with minimal exertion and prefers to sit, cannot lie down flat, speaks in phrases, mild retractions, audible wheezing, pulse 100-120.    - SEVERE: Very SOB at rest, speaks in single words, struggling to breathe, sitting hunched forward, retractions, pulse > 120      no 6. FEVER: "Do you have a  fever?" If Yes, ask: "What is your temperature, how was it measured, and when did it start?"     no 7. CARDIAC HISTORY: "Do you have any history of heart disease?" (e.g., heart attack, congestive heart failure)      no 8. LUNG HISTORY: "Do you have any history of lung disease?"  (e.g., pulmonary embolus, asthma, emphysema)     no 9. PE RISK FACTORS: "Do you have a history of blood clots?" (or: recent major surgery, recent prolonged travel, bedridden)     no 10. OTHER SYMPTOMS: "Do you have any other symptoms?" (e.g., runny nose, wheezing, chest pain)       Chills, chest congestion, weakness  Protocols used: Cough - Acute Non-Productive-A-AH

## 2023-05-23 NOTE — Progress Notes (Signed)
      Established patient visit   Patient: Veronica Pennington   DOB: January 18, 1971   53 y.o. Female  MRN: [id number] Visit Date: 05/23/2023  Today's healthcare provider: Alfredia Ferguson, PA-C   Cc. Congestion, cough, wheezing  Subjective     Pt reports sinus pressure, headaches, thought 2/2 pollen, last week, worsened over the weekend with wheezing. Since then, most symptoms improved but she still has a persistent cough with burning in her throat. She has tried advil, Geneticist, molecular, and cold and flu medicine otc. Denies fevers, current wheezing.   Medications: No outpatient medications prior to visit.   No facility-administered medications prior to visit.    Review of Systems  Constitutional:  Negative for fatigue and fever.  Respiratory:  Positive for cough. Negative for shortness of breath.   Cardiovascular:  Negative for chest pain and leg swelling.  Gastrointestinal:  Negative for abdominal pain.  Neurological:  Negative for dizziness and headaches.       Objective    BP 118/74   Pulse 87   Temp 97.9 F (36.6 C)   Ht 5\' 7"  (1.702 m)   Wt 168 lb (76.2 kg)   LMP 02/16/2018 (Approximate)   SpO2 96%   BMI 26.31 kg/m    Physical Exam Constitutional:      General: She is awake.     Appearance: She is well-developed.  HENT:     Head: Normocephalic.     Right Ear: Tympanic membrane normal.     Left Ear: Tympanic membrane normal.     Mouth/Throat:     Pharynx: Posterior oropharyngeal erythema present. No oropharyngeal exudate.  Eyes:     Conjunctiva/sclera: Conjunctivae normal.  Cardiovascular:     Rate and Rhythm: Normal rate and regular rhythm.     Heart sounds: Normal heart sounds.  Pulmonary:     Effort: Pulmonary effort is normal.     Breath sounds: Normal breath sounds. No wheezing, rhonchi or rales.  Skin:    General: Skin is warm.  Neurological:     Mental Status: She is alert and oriented to person, place, and time.  Psychiatric:        Attention  and Perception: Attention normal.        Mood and Affect: Mood normal.        Speech: Speech normal.        Behavior: Behavior is cooperative.      No results found for any visits on 05/23/23.  Assessment & Plan    Acute cough -     Benzonatate; Take 1 capsule (100 mg total) by mouth 2 (two) times daily as needed.  Dispense: 20 capsule; Refill: 0  2/2 URI or allergies, pt is improving, Rx tessalon for cough, recommending hot teas, honey, cough drops, steam showers.  Return if symptoms worsen or fail to improve.       Alfredia Ferguson, PA-C  [facility] [phone] (phone) [phone] (fax)  [facility]

## 2023-05-29 ENCOUNTER — Other Ambulatory Visit: Payer: Self-pay

## 2023-05-29 ENCOUNTER — Ambulatory Visit
Admission: EM | Admit: 2023-05-29 | Discharge: 2023-05-29 | Disposition: A | Discharge status: Home / Self Care | Attending: Family Medicine | Admitting: Family Medicine

## 2023-05-29 DIAGNOSIS — J189 Pneumonia, unspecified organism: Secondary | ICD-10-CM | POA: Diagnosis not present

## 2023-05-29 MED ORDER — AMOXICILLIN-POT CLAVULANATE 875-125 MG PO TABS: 1.0000 | ORAL_TABLET | Freq: Two times a day (BID) | ORAL | 0 refills | Status: DC

## 2023-05-29 MED ORDER — AZITHROMYCIN 250 MG PO TABS: ORAL_TABLET | ORAL | 0 refills | Status: DC

## 2023-05-29 MED ORDER — PREDNISONE 50 MG PO TABS: ORAL_TABLET | ORAL | 0 refills | Status: DC

## 2023-05-29 MED ORDER — HYDROCOD POLI-CHLORPHE POLI ER 10-8 MG/5ML PO SUER: 5.0000 mL | Freq: Two times a day (BID) | ORAL | 0 refills | Status: DC | PRN

## 2023-05-29 NOTE — Discharge Instructions (Signed)
 Drink lots of fluids Take the Augmentin 2 times a day for a week Take the Z-Pak as directed.  2 pills today then 1 a day until gone Take prednisone daily for 5 days I have prescribed a stronger cough medicine to use every 12 hours.  This can cause drowsiness do not take before work See your doctor if not improving by next week

## 2023-05-29 NOTE — ED Provider Notes (Signed)
 [facility]    CSN: [id number] Arrival date & time: 05/29/23  0813      History   Chief Complaint Chief Complaint  Patient presents with   Cough    HPI Veronica Pennington is a 53 y.o. female.   Patient had cough and cold symptoms that she thought might be from allergies.  These were present for about a week.  Because of persistent symptoms she was seen on 05/23/2023.  She was given Tessalon for the cough.  She continues to have coughing.  Fatigue.  Cough keeps her up at night.  Chest pain with deep breath.  Scant sputum sometimes with yellow streaks.  No wheezing or shortness of breath.  No underlying asthma.  Non-smoker.    Past Medical History:  Diagnosis Date   Anxiety    Chest wall pain, chronic 11/10/2012   Costochondritis    Family history of adverse reaction to anesthesia    mother has nausea   Headache 05/05/2008   Formatting of this note might be different from the original. Headache  ICD-10 cut over   Mixed hyperlipidemia 11/11/2018   Palpitations 11/25/2012   Pectus excavatum 11/10/2012   Peptic ulcer 05/05/2008   Formatting of this note might be different from the original. Peptic Ulcer  10/1 IMO update   Recurrent boils 10/25/2019   Stress incontinence of urine 07/17/2020   Vaginal discharge 02/19/2017    Patient Active Problem List   Diagnosis Date Noted   Left leg pain 07/22/2022   Alcohol use disorder in remission 04/15/2022   Weakness 04/15/2022   Loose stools 04/15/2022   Sinus bradycardia 07/19/2020   Dizziness 07/19/2020   Stress incontinence of urine 07/17/2020   Family history of adverse reaction to anesthesia    Anxiety 10/25/2019   Costochondritis 10/25/2019   Recurrent boils 10/25/2019   Mixed hyperlipidemia 11/11/2018   Vaginal discharge 02/19/2017   Palpitations 11/25/2012   Chest wall pain, chronic 11/10/2012   Pectus excavatum 11/10/2012   Headache 05/05/2008   Peptic ulcer 05/05/2008    Past Surgical History:   Procedure Laterality Date   EXTRACORPOREAL SHOCK WAVE LITHOTRIPSY Left 05/15/2020   Procedure: EXTRACORPOREAL SHOCK WAVE LITHOTRIPSY (ESWL);  Surgeon: Jannifer Hick, MD;  Location: [facility];  Service: Urology;  Laterality: Left;   NO PAST SURGERIES      OB History   No obstetric history on file.      Home Medications    Prior to Admission medications   Medication Sig Start Date End Date Taking? Authorizing Provider  amoxicillin-clavulanate (AUGMENTIN) 875-125 MG tablet Take 1 tablet by mouth every 12 (twelve) hours. 05/29/23  Yes Eustace Moore, MD  azithromycin (ZITHROMAX Z-PAK) 250 MG tablet Take two pills today followed by one a day until gone 05/29/23  Yes Eustace Moore, MD  chlorpheniramine-HYDROcodone (TUSSIONEX) 10-8 MG/5ML Take 5 mLs by mouth every 12 (twelve) hours as needed for cough. 05/29/23  Yes Eustace Moore, MD  dextromethorphan-guaiFENesin [facility] DM) 30-600 MG 12hr tablet Take 1 tablet by mouth 2 (two) times daily.   Yes [provider]  DM-Doxylamine-Acetaminophen (NYQUIL COLD & FLU PO) Take by mouth.   Yes [provider]  predniSONE (DELTASONE) 50 MG tablet Take once a day for 5 days.  Take with food 05/29/23  Yes Eustace Moore, MD    Family History Family History  Problem Relation Age of Onset   Rheum arthritis Mother    Hypertension Mother  Ankylosing spondylitis Mother    Heart attack Father    Hypertension Father    Stroke Maternal Grandfather     Social History Social History   Tobacco Use   Smoking status: Former    Current packs/day: 0.00    Average packs/day: 1 pack/day for 10.0 years (10.0 ttl pk-yrs)    Types: Cigarettes    Start date: 49    Quit date: 1998    Years since quitting: 27.3   Smokeless tobacco: Never  Substance Use Topics   Alcohol use: Not Currently   Drug use: No     Allergies   Other and Latex   Review of Systems Review of Systems See HPI  Physical  Exam Triage Vital Signs ED Triage Vitals  Encounter Vitals Group     BP 05/29/23 0826 120/85     Systolic BP Percentile --      Diastolic BP Percentile --      Pulse Rate 05/29/23 0826 68     Resp 05/29/23 0826 16     Temp 05/29/23 0826 98.5 F (36.9 C)     Temp src --      SpO2 05/29/23 0826 95 %     Weight --      Height --      Head Circumference --      Peak Flow --      Pain Score 05/29/23 0831 2     Pain Loc --      Pain Education --      Exclude from Growth Chart --    No data found.  Updated Vital Signs BP 120/85   Pulse 68   Temp 98.5 F (36.9 C)   Resp 16   LMP 02/16/2018 (Approximate)   SpO2 95%   Physical Exam Constitutional:      General: She is not in acute distress.    Appearance: She is well-developed and normal weight. She is ill-appearing.  HENT:     Head: Normocephalic and atraumatic.     Right Ear: Tympanic membrane and ear canal normal.     Left Ear: Tympanic membrane and ear canal normal.     Nose: Nose normal.     Mouth/Throat:     Mouth: Mucous membranes are moist.     Pharynx: No posterior oropharyngeal erythema.     Comments: Generous size tonsil.  No inflammation Eyes:     Conjunctiva/sclera: Conjunctivae normal.     Pupils: Pupils are equal, round, and reactive to light.  Cardiovascular:     Rate and Rhythm: Normal rate and regular rhythm.     Heart sounds: Normal heart sounds.  Pulmonary:     Effort: Pulmonary effort is normal. No respiratory distress.     Breath sounds: Rales present.     Comments: Definite rales localized to left base Abdominal:     General: There is no distension.     Palpations: Abdomen is soft.  Musculoskeletal:        General: Normal range of motion.     Cervical back: Normal range of motion.     Right lower leg: No edema.     Left lower leg: No edema.  Lymphadenopathy:     Cervical: No cervical adenopathy.  Skin:    General: Skin is warm and dry.  Neurological:     General: No focal deficit  present.     Mental Status: She is alert.      UC Treatments / Results  Labs (  all labs ordered are listed, but only abnormal results are displayed) Labs Reviewed - No data to display  EKG   Radiology No results found.  Procedures Procedures (including critical care time)  Medications Ordered in UC Medications - No data to display  Initial Impression / Assessment and Plan / UC Course  I have reviewed the triage vital signs and the nursing notes.  Pertinent labs & imaging results that were available during my care of the patient were reviewed by me and considered in my medical decision making (see chart for details).     Patient has history and physical exam findings suggestive of community-acquired pneumonia.  Will treat accordingly Final Clinical Impressions(s) / UC Diagnoses   Final diagnoses:  Community acquired pneumonia of left lower lobe of lung     Discharge Instructions      Drink lots of fluids Take the Augmentin 2 times a day for a week Take the Z-Pak as directed.  2 pills today then 1 a day until gone Take prednisone daily for 5 days I have prescribed a stronger cough medicine to use every 12 hours.  This can cause drowsiness do not take before work See your doctor if not improving by next week   ED Prescriptions     Medication Sig Dispense Auth. Provider   amoxicillin-clavulanate (AUGMENTIN) 875-125 MG tablet Take 1 tablet by mouth every 12 (twelve) hours. 14 tablet Stephany Ehrich, MD   azithromycin (ZITHROMAX Z-PAK) 250 MG tablet Take two pills today followed by one a day until gone 6 tablet Stephany Ehrich, MD   predniSONE (DELTASONE) 50 MG tablet Take once a day for 5 days.  Take with food 5 tablet Stephany Ehrich, MD   chlorpheniramine-HYDROcodone (TUSSIONEX) 10-8 MG/5ML Take 5 mLs by mouth every 12 (twelve) hours as needed for cough. 115 mL Stephany Ehrich, MD      PDMP not reviewed this encounter.   Stephany Ehrich,  MD 05/29/23 [phone]

## 2023-05-29 NOTE — ED Triage Notes (Addendum)
 Cough since last weekend, had sharp pain and wheezing. Reports airway inflamed and coughing now. Has not slept in over a week, reports tickle in throat when laying down and having coughing spells. Reports her cough suppressant is not working. Has taken nyquil as well. Went to an urgent care on Friday where she received the cough suppressant.

## 2023-09-26 ENCOUNTER — Telehealth: Payer: Self-pay | Admitting: Medical-Surgical

## 2023-09-26 DIAGNOSIS — E782 Mixed hyperlipidemia: Secondary | ICD-10-CM

## 2023-09-26 DIAGNOSIS — Z0000 Encounter for general adult medical examination without abnormal findings: Secondary | ICD-10-CM

## 2023-09-26 NOTE — Telephone Encounter (Signed)
 Lab orders placed.   ___________________________________________ Zada FREDRIK Palin, DNP, APRN, FNP-BC Primary Care and Sports Medicine [facility]

## 2023-09-26 NOTE — Telephone Encounter (Signed)
 Copied from CRM [id number]. Topic: Appointments - Scheduling Inquiry for Clinic >> Sep 25, 2023  5:03 PM Graeme ORN wrote: Reason for CRM: Patient called. Scheduled appt for Physical. Wanted to know how to get blood work completed since most 1st appts in after noon. Appt scheduled 8/28 1:40. Thank You    ----------------------------------------------------------------------- From previous Reason for Contact - Scheduling: Patient/patient representative is calling to schedule an appointment. Refer to attachments for appointment information.

## 2023-10-09 ENCOUNTER — Encounter: Admitting: Medical-Surgical

## 2023-10-14 ENCOUNTER — Encounter: Payer: Self-pay | Admitting: Sports Medicine

## 2023-10-20 LAB — HM MAMMOGRAPHY

## 2023-10-23 ENCOUNTER — Encounter: Payer: Self-pay | Admitting: Medical-Surgical

## 2023-10-23 ENCOUNTER — Ambulatory Visit (INDEPENDENT_AMBULATORY_CARE_PROVIDER_SITE_OTHER): Admitting: Medical-Surgical

## 2023-10-23 VITALS — BP 111/72 | HR 54 | Resp 20 | Ht 67.0 in | Wt 174.0 lb

## 2023-10-23 DIAGNOSIS — E782 Mixed hyperlipidemia: Secondary | ICD-10-CM

## 2023-10-23 DIAGNOSIS — Z0000 Encounter for general adult medical examination without abnormal findings: Secondary | ICD-10-CM

## 2023-10-23 DIAGNOSIS — Z1211 Encounter for screening for malignant neoplasm of colon: Secondary | ICD-10-CM

## 2023-10-23 DIAGNOSIS — R053 Chronic cough: Secondary | ICD-10-CM

## 2023-10-23 DIAGNOSIS — Z23 Encounter for immunization: Secondary | ICD-10-CM

## 2023-10-23 NOTE — Progress Notes (Signed)
 Complete physical exam  Patient: Veronica Pennington   DOB: 1970-09-20   53 y.o. Female  MRN: [id number]  Subjective:    Chief Complaint  Patient presents with   Annual Exam    Veronica Pennington is a 53 y.o. female who presents today for a complete physical exam. She reports consuming a general diet. Walking 30 min daily five time per week. She generally feels fairly well. She reports sleeping fairly well. She does not have additional problems to discuss today.    Most recent fall risk assessment:    05/29/2022    9:58 AM  Fall Risk   Falls in the past year? 0  Number falls in past yr: 0  Injury with Fall? 0  Risk for fall due to : No Fall Risks  Follow up Falls evaluation completed     Most recent depression screenings:    10/23/2023    2:45 PM 05/29/2022    9:58 AM  PHQ 2/9 Scores  PHQ - 2 Score 0 0  Exception Documentation  Medical reason    Vision:Within last year and Dental: No current dental problems and Receives regular dental care    Patient Care Team: Willo Mini, NP as PCP - General (Nurse Practitioner)   Outpatient Medications Prior to Visit  Medication Sig   [DISCONTINUED] amoxicillin -clavulanate (AUGMENTIN ) 875-125 MG tablet Take 1 tablet by mouth every 12 (twelve) hours.   [DISCONTINUED] azithromycin  (ZITHROMAX  Z-PAK) 250 MG tablet Take two pills today followed by one a day until gone   [DISCONTINUED] chlorpheniramine-HYDROcodone  (TUSSIONEX) 10-8 MG/5ML Take 5 mLs by mouth every 12 (twelve) hours as needed for cough.   [DISCONTINUED] dextromethorphan-guaiFENesin (MUCINEX DM) 30-600 MG 12hr tablet Take 1 tablet by mouth 2 (two) times daily.   [DISCONTINUED] DM-Doxylamine-Acetaminophen  (NYQUIL COLD & FLU PO) Take by mouth.   [DISCONTINUED] predniSONE  (DELTASONE ) 50 MG tablet Take once a day for 5 days.  Take with food   No facility-administered medications prior to visit.    Review of Systems  Constitutional:  Negative for chills, fever,  malaise/fatigue and weight loss.  HENT:  Positive for congestion. Negative for ear pain, hearing loss, sinus pain and sore throat.   Eyes:  Negative for blurred vision, photophobia and pain.  Respiratory:  Positive for cough. Negative for shortness of breath and wheezing.   Cardiovascular:  Negative for chest pain, palpitations and leg swelling.  Gastrointestinal:  Negative for abdominal pain, constipation, diarrhea, heartburn, nausea and vomiting.  Genitourinary:  Negative for dysuria, frequency and urgency.  Musculoskeletal:  Negative for falls and neck pain.  Skin:  Negative for itching and rash.  Neurological:  Positive for headaches. Negative for dizziness and weakness.  Endo/Heme/Allergies:  Negative for polydipsia. Does not bruise/bleed easily.  Psychiatric/Behavioral:  Negative for depression, substance abuse and suicidal ideas. The patient is nervous/anxious. The patient does not have insomnia.      Objective:    BP 111/72 (BP Location: Right Arm, Cuff Size: Normal)   Pulse (!) 54   Resp 20   Ht 5' 7 (1.702 m)   Wt 174 lb (78.9 kg)   LMP 02/16/2018 (Approximate)   SpO2 98%   BMI 27.25 kg/m    Physical Exam Constitutional:      General: She is not in acute distress.    Appearance: Normal appearance. She is not ill-appearing.  HENT:     Head: Normocephalic and atraumatic.     Right Ear: Tympanic membrane, ear canal and external ear normal.  There is no impacted cerumen.     Left Ear: Tympanic membrane, ear canal and external ear normal. There is no impacted cerumen.     Nose: Nose normal. No congestion or rhinorrhea.     Mouth/Throat:     Mouth: Mucous membranes are moist.     Pharynx: No oropharyngeal exudate or posterior oropharyngeal erythema.  Eyes:     General: No scleral icterus.       Right eye: No discharge.        Left eye: No discharge.     Extraocular Movements: Extraocular movements intact.     Conjunctiva/sclera: Conjunctivae normal.     Pupils: Pupils  are equal, round, and reactive to light.  Neck:     Thyroid: No thyromegaly.     Vascular: No carotid bruit or JVD.     Trachea: Trachea normal.  Cardiovascular:     Rate and Rhythm: Normal rate and regular rhythm.     Pulses: Normal pulses.     Heart sounds: Normal heart sounds. No murmur heard.    No friction rub. No gallop.  Pulmonary:     Effort: Pulmonary effort is normal. No respiratory distress.     Breath sounds: Normal breath sounds. No wheezing.  Abdominal:     General: Bowel sounds are normal. There is no distension.     Palpations: Abdomen is soft.     Tenderness: There is no abdominal tenderness. There is no guarding.  Musculoskeletal:        General: Normal range of motion.     Cervical back: Normal range of motion and neck supple.  Lymphadenopathy:     Cervical: No cervical adenopathy.  Skin:    General: Skin is warm and dry.  Neurological:     Mental Status: She is alert and oriented to person, place, and time.     Cranial Nerves: No cranial nerve deficit.  Psychiatric:        Mood and Affect: Mood normal.        Behavior: Behavior normal.        Thought Content: Thought content normal.        Judgment: Judgment normal.    Results for orders placed or performed in visit on 10/23/23  HM MAMMOGRAPHY  Result Value Ref Range   HM Mammogram 0-4 Bi-Rad 0-4 Bi-Rad, Self Reported Normal       Assessment & Plan:    Routine Health Maintenance and Physical Exam  Immunization History  Administered Date(s) Administered   PFIZER(Purple Top)SARS-COV-2 Vaccination 06/29/2019, 07/20/2019   Tdap 10/23/2023    Health Maintenance  Topic Date Due   Hepatitis B Vaccines 19-59 Average Risk (1 of 3 - 19+ 3-dose series) Never done   Colonoscopy  Never done   Pneumococcal Vaccine: 50+ Years (1 of 1 - PCV) Never done   Cervical Cancer Screening (HPV/Pap Cotest)  03/29/2022   COVID-19 Vaccine (3 - 2025-26 season) 11/08/2023 (Originally 10/13/2023)   Influenza Vaccine   05/11/2024 (Originally 09/12/2023)   Mammogram  10/19/2025   HPV VACCINES  Aged Out   Meningococcal B Vaccine  Aged Out   DTaP/Tdap/Td  Discontinued   Hepatitis C Screening  Discontinued   HIV Screening  Discontinued   Zoster Vaccines- Shingrix  Discontinued    Discussed health benefits of physical activity, and encouraged her to engage in regular exercise appropriate for her age and condition.  1. Annual physical exam (Primary) Labs drawn this morning, pending results. UTD on preventative care. Wellness  information provided with AVS.   2. Mixed hyperlipidemia Lipid panel pending.   3. Colon cancer screening Referring to GI for colonoscopy.  - Ambulatory referral to Gastroenterology  4. Persistent cough Unclear etiology. Approximately 5 months since her URI/PNA. Considered post-nasal drip, allergies, or GERD. Lungs CTA so less suspicious of infection. Plan to trial OTC Nexium or Prilosec for 2 weeks to evaluate for improvement. If not helpful, consider adding a daily antihistamine. Can use Mucinex to help thin secretions.  5. Need for tetanus booster Tdap given in office today.  - Tdap vaccine greater than or equal to 7yo IM   Return in about 1 year (around 10/22/2024) for annual physical exam or sooner if needed.     Willow Reczek, NP

## 2023-10-23 NOTE — Patient Instructions (Signed)
 Preventive Care 54-54 Years Old, Female  Preventive care refers to lifestyle choices and visits with your health care provider that can promote health and wellness. Preventive care visits are also called wellness exams.  What can I expect for my preventive care visit?  Counseling  Your health care provider may ask you questions about your:  Medical history, including:  Past medical problems.  Family medical history.  Pregnancy history.  Current health, including:  Menstrual cycle.  Method of birth control.  Emotional well-being.  Home life and relationship well-being.  Sexual activity and sexual health.  Lifestyle, including:  Alcohol, nicotine or tobacco, and drug use.  Access to firearms.  Diet, exercise, and sleep habits.  Work and work Astron[employer].  Sunscreen use.  Safety issues such as seatbelt and bike helmet use.  Physical exam  Your health care provider will check your:  Height and weight. These may be used to calculate your BMI (body mass index). BMI is a measurement that tells if you are at a healthy weight.  Waist circumference. This measures the distance around your waistline. This measurement also tells if you are at a healthy weight and may help predict your risk of certain diseases, such as type 2 diabetes and high blood pressure.  Heart rate and blood pressure.  Body temperature.  Skin for abnormal spots.  What immunizations do I need?    Vaccines are usually given at various ages, according to a schedule. Your health care provider will recommend vaccines for you based on your age, medical history, and lifestyle or other factors, such as travel or where you work.  What tests do I need?  Screening  Your health care provider may recommend screening tests for certain conditions. This may include:  Lipid and cholesterol levels.  Diabetes screening. This is done by checking your blood sugar (glucose) after you have not eaten for a while (fasting).  Pelvic exam and Pap test.  Hepatitis B test.  Hepatitis C  test.  HIV (human immunodeficiency virus) test.  STI (sexually transmitted infection) testing, if you are at risk.  Lung cancer screening.  Colorectal cancer screening.  Mammogram. Talk with your health care provider about when you should start having regular mammograms. This may depend on whether you have a family history of breast cancer.  BRCA-related cancer screening. This may be done if you have a family history of breast, ovarian, tubal, or peritoneal cancers.  Bone density scan. This is done to screen for osteoporosis.  Talk with your health care provider about your test results, treatment options, and if necessary, the need for more tests.  Follow these instructions at home:  Eating and drinking    Eat a diet that includes fresh fruits and vegetables, whole grains, lean protein, and low-fat dairy products.  Take vitamin and mineral supplements as recommended by your health care provider.  Do not drink alcohol if:  Your health care provider tells you not to drink.  You are pregnant, may be pregnant, or are planning to become pregnant.  If you drink alcohol:  Limit how much you have to 0-1 drink a day.  Know how much alcohol is in your drink. In the [place] one drink equals one 12 oz bottle of beer (355 mL), one 5 oz glass of wine (148 mL), or one 1 oz glass of hard liquor (44 mL).  Lifestyle  Brush your teeth every morning and night with fluoride toothpaste. Floss one time each day.  Exercise for at least  30 minutes 5 or more days each week.  Do not use any products that contain nicotine or tobacco. These products include cigarettes, chewing tobacco, and vaping devices, such as e-cigarettes. If you need help quitting, ask your health care provider.  Do not use drugs.  If you are sexually active, practice safe sex. Use a condom or other form of protection to prevent STIs.  If you do not wish to become pregnant, use a form of birth control. If you plan to become pregnant, see your health care provider for a  prepregnancy visit.  Take aspirin only as told by your health care provider. Make sure that you understand how much to take and what form to take. Work with your health care provider to find out whether it is safe and beneficial for you to take aspirin daily.  Find healthy ways to manage stress, such as:  Meditation, yoga, or listening to music.  Journaling.  Talking to a trusted person.  Spending time with friends and family.  Minimize exposure to UV radiation to reduce your risk of skin cancer.  Safety  Always wear your seat belt while driving or riding in a vehicle.  Do not drive:  If you have been drinking alcohol. Do not ride with someone who has been drinking.  When you are tired or distracted.  While texting.  If you have been using any mind-altering substances or drugs.  Wear a helmet and other protective equipment during sports activities.  If you have firearms in your house, make sure you follow all gun safety procedures.  Seek help if you have been physically or sexually abused.  What's next?  Visit your health care provider once a year for an annual wellness visit.  Ask your health care provider how often you should have your eyes and teeth checked.  Stay up to date on all vaccines.  This information is not intended to replace advice given to you by your health care provider. Make sure you discuss any questions you have with your health care provider.  Document Revised: 07/26/2020 Document Reviewed: 07/26/2020  [employer]vier Patient Education  2024 [employer].

## 2023-10-24 ENCOUNTER — Ambulatory Visit: Payer: Self-pay | Admitting: Medical-Surgical

## 2023-10-24 LAB — CBC
Hematocrit: 43 % (ref 34.0–46.6)
Hemoglobin: 14.2 g/dL (ref 11.1–15.9)
MCH: 31.6 pg (ref 26.6–33.0)
MCHC: 33 g/dL (ref 31.5–35.7)
MCV: 96 fL (ref 79–97)
Platelets: 225 x10E3/uL (ref 150–450)
RBC: 4.5 x10E6/uL (ref 3.77–5.28)
RDW: 12.4 % (ref 11.7–15.4)
WBC: 5 x10E3/uL (ref 3.4–10.8)

## 2023-10-24 LAB — LIPID PANEL
Chol/HDL Ratio: 3.7 ratio (ref 0.0–4.4)
Cholesterol, Total: 245 mg/dL — ABNORMAL HIGH (ref 100–199)
HDL: 67 mg/dL (ref >39)
LDL Chol Calc (NIH): 162 mg/dL — ABNORMAL HIGH (ref 0–99)
Triglycerides: 93 mg/dL (ref 0–149)
VLDL Cholesterol Cal: 16 mg/dL (ref 5–40)

## 2023-10-24 LAB — CMP14+EGFR
ALT: 23 IU/L (ref 0–32)
AST: 17 IU/L (ref 0–40)
Albumin: 4.7 g/dL (ref 3.8–4.9)
Alkaline Phosphatase: 60 IU/L (ref 44–121)
BUN/Creatinine Ratio: 11 (ref 9–23)
BUN: 10 mg/dL (ref 6–24)
Bilirubin Total: 0.6 mg/dL (ref 0.0–1.2)
CO2: 22 mmol/L (ref 20–29)
Calcium: 9.4 mg/dL (ref 8.7–10.2)
Chloride: 104 mmol/L (ref 96–106)
Creatinine, Ser: 0.9 mg/dL (ref 0.57–1.00)
Globulin, Total: 2 g/dL (ref 1.5–4.5)
Glucose: 92 mg/dL (ref 70–99)
Potassium: 4.1 mmol/L (ref 3.5–5.2)
Sodium: 139 mmol/L (ref 134–144)
Total Protein: 6.7 g/dL (ref 6.0–8.5)
eGFR: 76 mL/min/1.73 (ref >59)
